# Patient Record
Sex: Female | Born: 2000 | Race: White | Hispanic: No | Marital: Single | Smoking: Never smoker
Health system: Southern US, Community
[De-identification: ages and names within clinical notes are randomized; demographics above are authoritative.]

## PROBLEM LIST (undated history)

## (undated) DIAGNOSIS — Q639 Congenital malformation of kidney, unspecified: Secondary | ICD-10-CM

## (undated) DIAGNOSIS — N289 Disorder of kidney and ureter, unspecified: Secondary | ICD-10-CM

---
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
## 2020-02-01 ENCOUNTER — Emergency Department: Payer: Medicaid Other

## 2020-02-01 ENCOUNTER — Emergency Department
Admission: EM | Admit: 2020-02-01 | Discharge: 2020-02-01 | Disposition: A | Discharge status: Home / Self Care | Payer: Medicaid Other | Attending: Emergency Medicine | Admitting: Emergency Medicine

## 2020-02-01 ENCOUNTER — Other Ambulatory Visit: Payer: Self-pay

## 2020-02-01 ENCOUNTER — Encounter: Payer: Self-pay | Admitting: Emergency Medicine

## 2020-02-01 DIAGNOSIS — S9031XA Contusion of right foot, initial encounter: Secondary | ICD-10-CM

## 2020-02-01 DIAGNOSIS — W2209XA Striking against other stationary object, initial encounter: Secondary | ICD-10-CM | POA: Diagnosis not present

## 2020-02-01 DIAGNOSIS — R52 Pain, unspecified: Secondary | ICD-10-CM

## 2020-02-01 DIAGNOSIS — Q600 Renal agenesis, unilateral: Secondary | ICD-10-CM | POA: Insufficient documentation

## 2020-02-01 DIAGNOSIS — S90414A Abrasion, right lesser toe(s), initial encounter: Secondary | ICD-10-CM | POA: Insufficient documentation

## 2020-02-01 DIAGNOSIS — Y9389 Activity, other specified: Secondary | ICD-10-CM | POA: Insufficient documentation

## 2020-02-01 DIAGNOSIS — S99921A Unspecified injury of right foot, initial encounter: Secondary | ICD-10-CM | POA: Diagnosis present

## 2020-02-01 DIAGNOSIS — Y999 Unspecified external cause status: Secondary | ICD-10-CM | POA: Diagnosis not present

## 2020-02-01 DIAGNOSIS — Y929 Unspecified place or not applicable: Secondary | ICD-10-CM | POA: Diagnosis not present

## 2020-02-01 NOTE — ED Provider Notes (Signed)
[facility] Emergency Department Provider Note   ____________________________________________   First MD Initiated Contact with Patient 02/01/20 1238     (approximate)  I have reviewed the triage vital signs and the nursing notes.   HISTORY  Chief Complaint Toe Pain    HPI Sherry Floyd is a 19 y.o. female patient presents with pain to the right second toe secondary to contusion/abrasion.  Patient state her toe was "underneath the door that her brother was coming through.  Incident occurred prior to arrival.  Patient rates pain as a 3/10.  Patient described the pain as "achy".  No palliative measure for complaint.         Past Medical History:  Diagnosis Date  . 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 CELLULITIS, LEFT KNEE 08/18/2009    History reviewed. No pertinent surgical history.  Prior to Admission medications   Medication Sig Start Date End Date Taking? Authorizing Provider  fluticasone (FLONASE) 50 MCG/ACT nasal spray Place 2 sprays into both nostrils daily as needed for allergies or rhinitis.    [provider]    Allergies Patient has no known allergies.  Family History  Problem Relation Age of Onset  . Hypertension Other   . Liver disease Other     Social History Social History   Tobacco Use  . Smoking status: Never Smoker  . Smokeless tobacco: Never Used  Substance Use Topics  . Alcohol use: No  . Drug use: No    Review of Systems Constitutional: No fever/chills Eyes: No visual changes. ENT: No sore throat. Cardiovascular: Denies chest pain. Respiratory: Denies shortness of breath. Gastrointestinal: No abdominal pain.  No nausea, no vomiting.  No diarrhea.  No constipation. Genitourinary: Negative for dysuria. Musculoskeletal: Right second toe pain. Skin: Negative  for rash.  Abrasion right second toe Neurological: Negative for headaches, focal weakness or numbness.   ____________________________________________   PHYSICAL EXAM:  VITAL SIGNS: ED Triage Vitals  Enc Vitals Group     BP 02/01/20 1240 115/69     Pulse Rate 02/01/20 1240 84     Resp 02/01/20 1240 14     Temp 02/01/20 1240 98.6 F (37 C)     Temp src --      SpO2 02/01/20 1240 99 %     Weight 02/01/20 1235 103 lb (46.7 kg)     Height 02/01/20 1235 5\' 3"  (1.6 m)     Head Circumference --      Peak Flow --      Pain Score 02/01/20 1235 3     Pain Loc --      Pain Edu? --      Excl. in GC? --     Constitutional: Alert and oriented. Well appearing and in no acute distress. Cardiovascular: Normal rate, regular rhythm. Grossly normal heart sounds.  Good peripheral circulation. Respiratory: Normal respiratory effort.  No retractions. Lungs CTAB. Musculoskeletal: No obvious deformity to the right second toe.  Neurologic:  Normal speech and language. No gross focal neurologic deficits are appreciated. No gait instability. Skin:  Skin is warm, dry and intact. No rash noted.  Abrasion dorsal aspect of second right toe. Psychiatric: Mood and affect are normal. Speech and behavior are normal.  ____________________________________________   LABS (all labs ordered are listed, but  only abnormal results are displayed)  Labs Reviewed - No data to display ____________________________________________  EKG   ____________________________________________  RADIOLOGY  ED MD interpretation:    Official radiology report(s): DG Foot Complete Right  Result Date: 02/01/2020 CLINICAL DATA:  Right foot pain after striking foot on door yesterday. Pain in the great and second toe. EXAM: RIGHT FOOT COMPLETE - 3+ VIEW COMPARISON:  Radiograph 09/28/2017 FINDINGS: There is no evidence of fracture or dislocation. There is no evidence of arthropathy or other focal bone abnormality. Suggestion of pes  planus on these nonweightbearing views. Soft tissues are unremarkable. IMPRESSION: No fracture or dislocation of the right foot. Electronically Signed   By: Keith Rake M.D.   On: 02/01/2020 13:13    ____________________________________________   PROCEDURES  Procedure(s) performed (including Critical Care):  Procedures   ____________________________________________   INITIAL IMPRESSION / ASSESSMENT AND PLAN / ED COURSE  As part of my medical decision making, I reviewed the following data within the [place]     Patient presents with pain to the first and second digit right foot secondary to contusion.  Discussed negative x-ray findings with patient.  Patient given discharge care instruction advised follow-up PCP.    Sherry Floyd was evaluated in Emergency Department on 02/01/2020 for the symptoms described in the history of present illness. She was evaluated in the context of the global COVID-19 pandemic, which necessitated consideration that the patient might be at risk for infection with the SARS-CoV-2 virus that causes COVID-19. Institutional protocols and algorithms that pertain to the evaluation of patients at risk for COVID-19 are in a state of rapid change based on information released by regulatory bodies including the CDC and federal and state organizations. These policies and algorithms were followed during the patient's care in the ED.       ____________________________________________   FINAL CLINICAL IMPRESSION(S) / ED DIAGNOSES  Final diagnoses:  Pain  Contusion of right foot, initial encounter     ED Discharge Orders    None       Note:  This document was prepared using Dragon voice recognition software and may include unintentional dictation errors.    Sable Feil, PA-C 02/01/20 1323    Vanessa Austin, MD 02/02/20 [phone]

## 2020-02-01 NOTE — ED Triage Notes (Signed)
States her brother went thur a Building services engineer back her right 2nd toe

## 2020-02-01 NOTE — Discharge Instructions (Addendum)
Your x-rays negative for fracture.  Follow discharge care instructions advised over-the-counter ibuprofen or Tylenol as needed for pain.

## 2021-06-30 IMAGING — DX DG FOOT COMPLETE 3+V*R*
3 series · 3 of 3 positions shown · non-contrast
Comparison: Radiograph 09/28/2017

CLINICAL DATA: Right foot pain after striking foot on door
yesterday. Pain in the great and second toe.

EXAM:
RIGHT FOOT COMPLETE - 3+ VIEW

[toe ap]
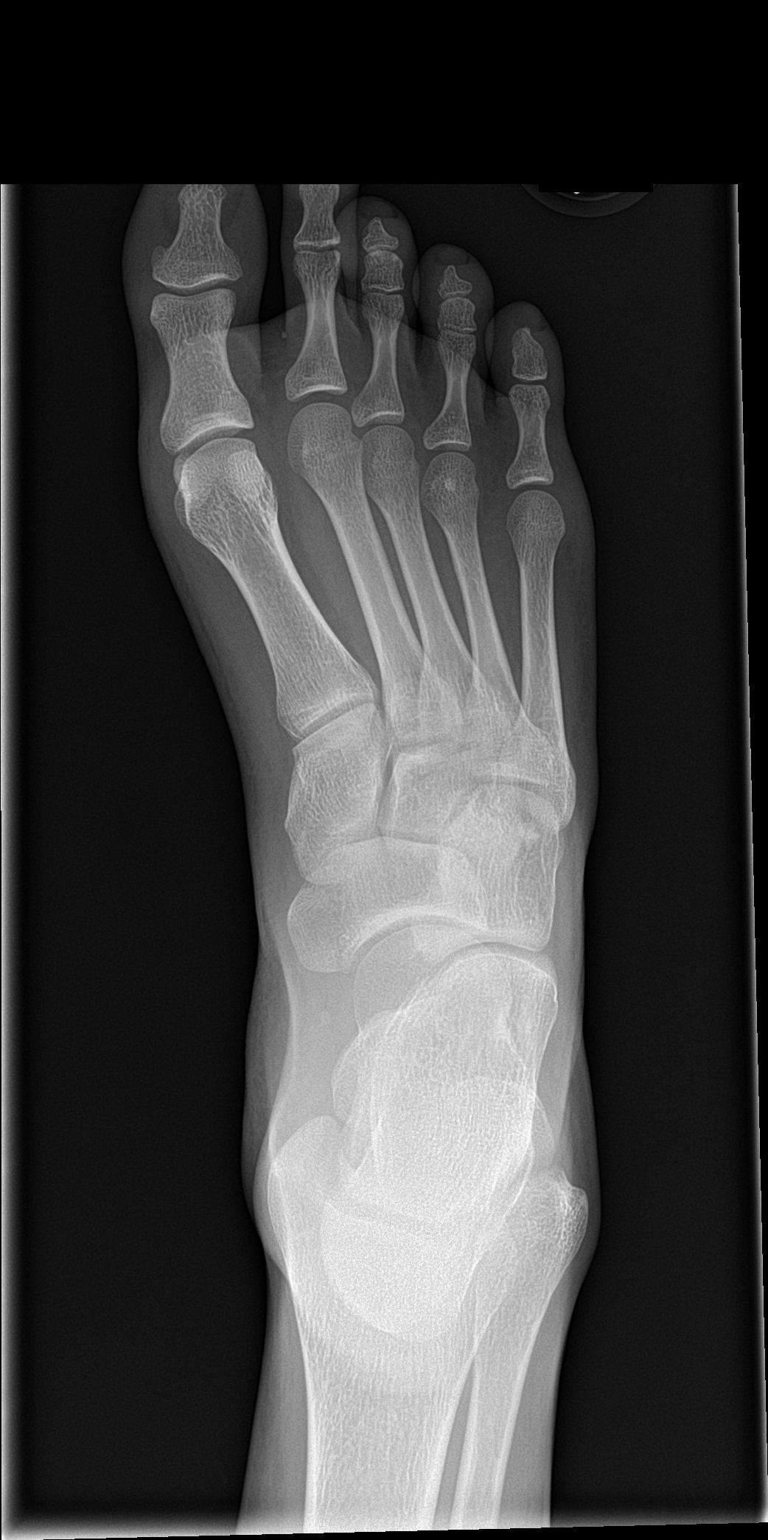

[toe obl]
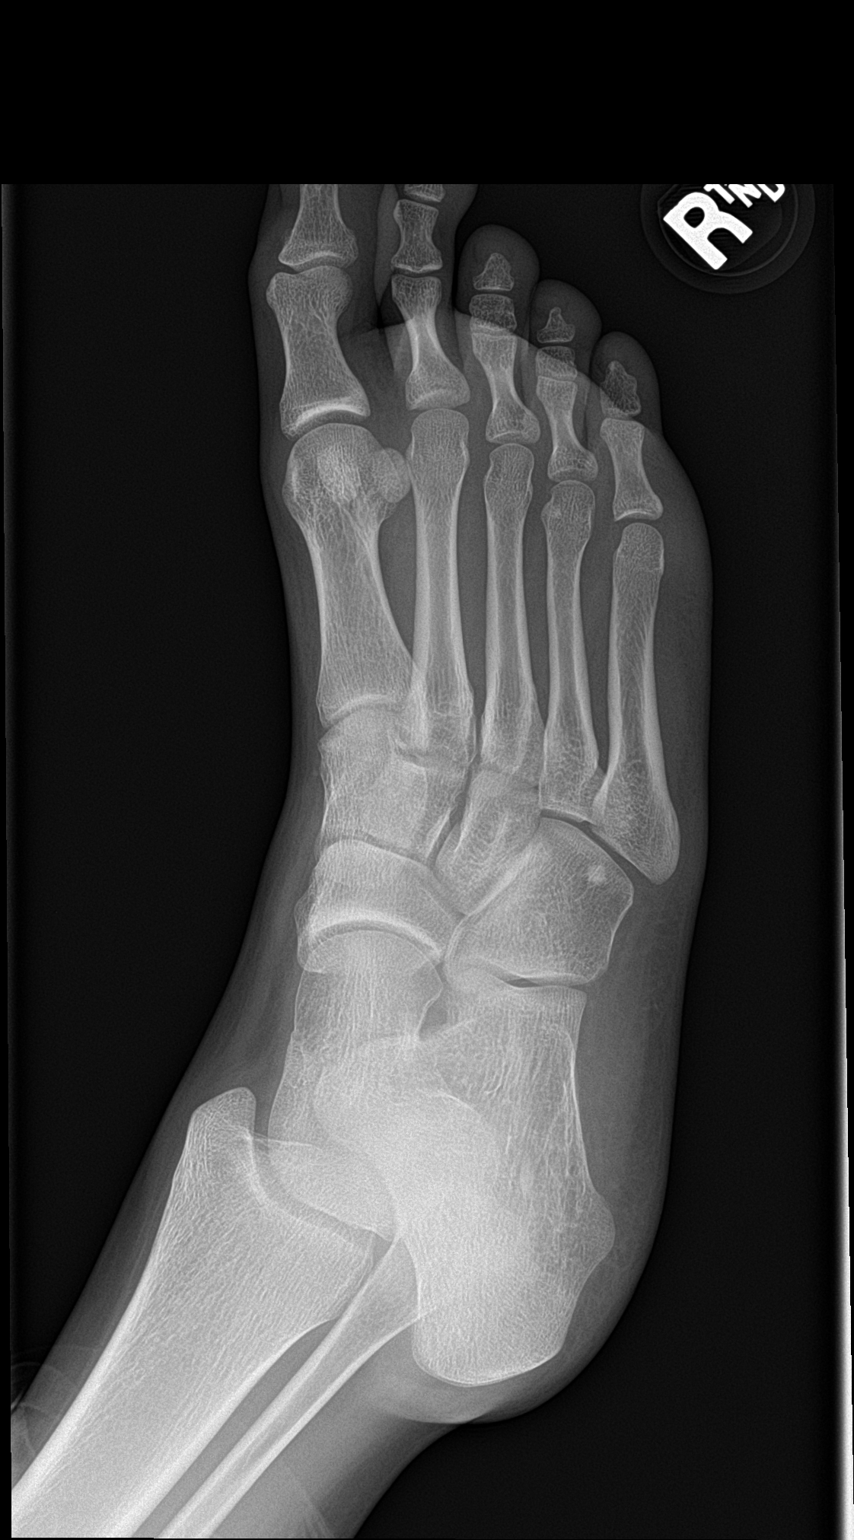

[toe lat]
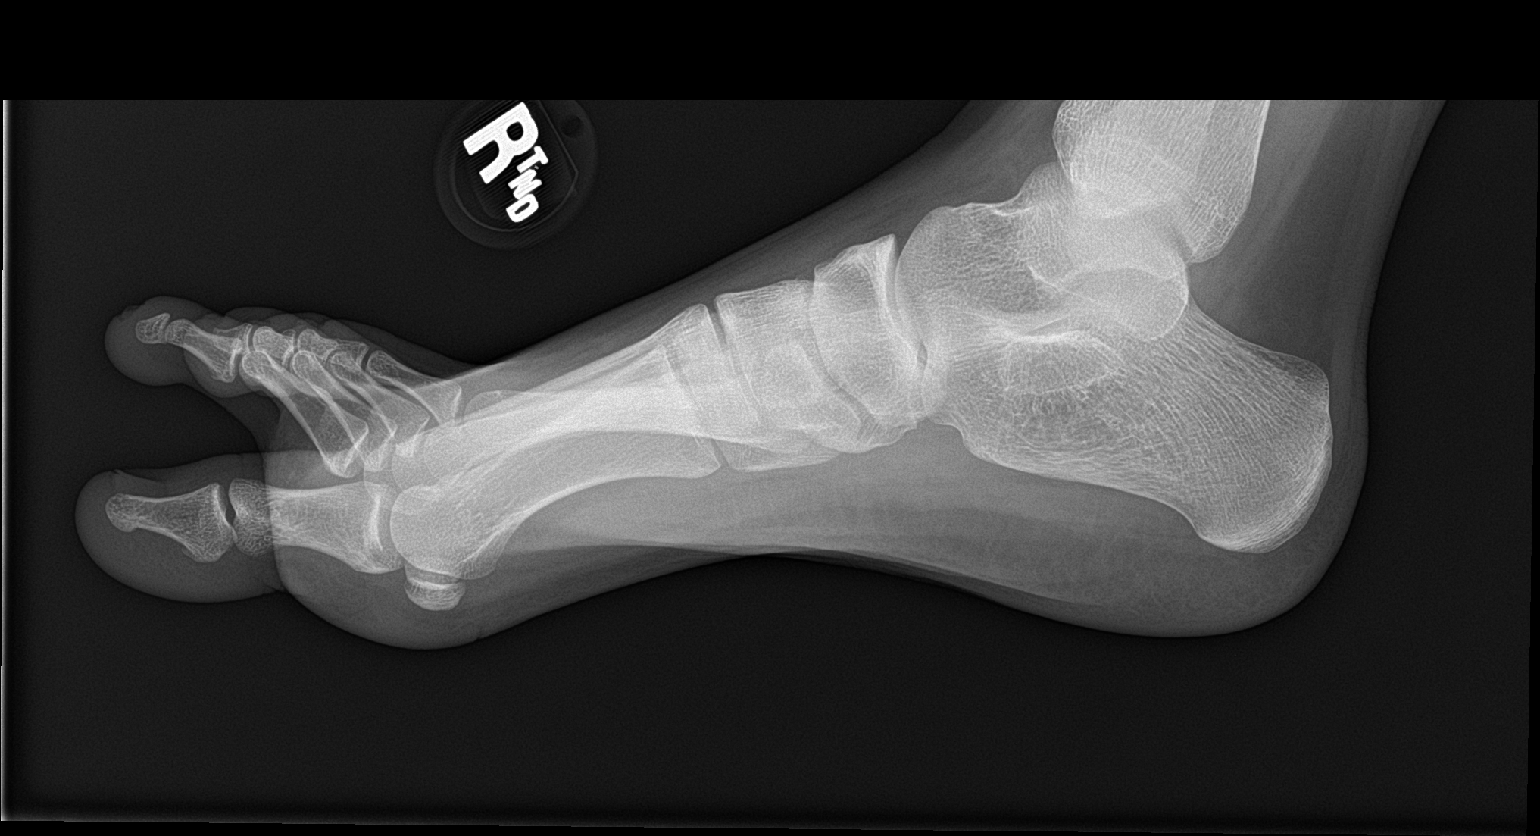

[3 of 3 positions shown; findings below may reference images not displayed]

FINDINGS: There is no evidence of fracture or dislocation. There is no
evidence of arthropathy or other focal bone abnormality. Suggestion
of pes planus on these nonweightbearing views. Soft tissues are
unremarkable.
IMPRESSION: No fracture or dislocation of the right foot.

## 2022-01-05 ENCOUNTER — Telehealth: Payer: Self-pay

## 2022-01-05 NOTE — Telephone Encounter (Signed)
Copied from [place] [phone]. Topic: General - Other >> Jan 05, 2022  1:09 PM McGill, Nelva Bush wrote: Reason for CRM: Pt requested a call back to schedule a new patient appointment.  Insurance - MEDICAID

## 2022-01-05 NOTE — Telephone Encounter (Signed)
Appt made

## 2022-02-26 ENCOUNTER — Encounter: Payer: Medicaid Other | Admitting: Obstetrics

## 2022-03-15 ENCOUNTER — Ambulatory Visit: Payer: Medicaid Other | Admitting: Nurse Practitioner

## 2022-03-16 ENCOUNTER — Other Ambulatory Visit (HOSPITAL_COMMUNITY)
Admission: RE | Admit: 2022-03-16 | Discharge: 2022-03-16 | Disposition: A | Discharge status: Home / Self Care | Payer: Medicaid Other | Source: Ambulatory Visit | Attending: Nurse Practitioner | Admitting: Nurse Practitioner

## 2022-03-16 ENCOUNTER — Ambulatory Visit (INDEPENDENT_AMBULATORY_CARE_PROVIDER_SITE_OTHER): Payer: Medicaid Other | Admitting: Nurse Practitioner

## 2022-03-16 ENCOUNTER — Other Ambulatory Visit: Payer: Self-pay

## 2022-03-16 ENCOUNTER — Encounter: Payer: Self-pay | Admitting: Nurse Practitioner

## 2022-03-16 VITALS — BP 110/70 | HR 98 | Temp 99.0°F | Resp 18 | Ht 63.5 in | Wt 118.5 lb

## 2022-03-16 DIAGNOSIS — Z309 Encounter for contraceptive management, unspecified: Secondary | ICD-10-CM

## 2022-03-16 DIAGNOSIS — R634 Abnormal weight loss: Secondary | ICD-10-CM

## 2022-03-16 DIAGNOSIS — Z7689 Persons encountering health services in other specified circumstances: Secondary | ICD-10-CM

## 2022-03-16 DIAGNOSIS — R350 Frequency of micturition: Secondary | ICD-10-CM | POA: Diagnosis not present

## 2022-03-16 DIAGNOSIS — F419 Anxiety disorder, unspecified: Secondary | ICD-10-CM | POA: Insufficient documentation

## 2022-03-16 DIAGNOSIS — Z114 Encounter for screening for human immunodeficiency virus [HIV]: Secondary | ICD-10-CM

## 2022-03-16 DIAGNOSIS — Z113 Encounter for screening for infections with a predominantly sexual mode of transmission: Secondary | ICD-10-CM | POA: Diagnosis not present

## 2022-03-16 DIAGNOSIS — Z1159 Encounter for screening for other viral diseases: Secondary | ICD-10-CM

## 2022-03-16 DIAGNOSIS — Z3009 Encounter for other general counseling and advice on contraception: Secondary | ICD-10-CM

## 2022-03-16 DIAGNOSIS — F320 Major depressive disorder, single episode, mild: Secondary | ICD-10-CM | POA: Insufficient documentation

## 2022-03-16 DIAGNOSIS — H6121 Impacted cerumen, right ear: Secondary | ICD-10-CM | POA: Diagnosis not present

## 2022-03-16 LAB — POCT URINALYSIS DIPSTICK
Bilirubin, UA: NEGATIVE
Color, UA: NEGATIVE
Glucose, UA: NEGATIVE
Ketones, UA: NEGATIVE
Nitrite, UA: NEGATIVE
Protein, UA: POSITIVE — AB
Spec Grav, UA: 1.02 (ref 1.010–1.025)
Urobilinogen, UA: 0.2 E.U./dL
pH, UA: 6 (ref 5.0–8.0)

## 2022-03-16 LAB — POCT URINE PREGNANCY: Preg Test, Ur: NEGATIVE

## 2022-03-16 MED ORDER — SERTRALINE HCL 25 MG PO TABS: 25.0000 mg | ORAL_TABLET | Freq: Every day | ORAL | 0 refills | Status: DC

## 2022-03-16 MED ORDER — MEDROXYPROGESTERONE ACETATE 150 MG/ML IM SUSY
150.0000 mg | PREFILLED_SYRINGE | INTRAMUSCULAR | Status: AC
  Administered 2022-03-16: 150 mg via INTRAMUSCULAR

## 2022-03-16 NOTE — Progress Notes (Signed)
? ?BP 110/70   Pulse 98   Temp 99 ?F (37.2 ?C) (Oral)   Resp 18   Ht 5' 3.5" (1.613 m)   Wt 118 lb 8 oz (53.8 kg)   SpO2 98%   BMI 20.66 kg/m?   ? ?Subjective:  ? ? Patient ID: Sherry Floyd, female    DOB: 02/23/2001, 21 y.o.   MRN: [id number] ? ?HPI: ?Sherry Floyd is a 21 y.o. female ? ?Chief Complaint  ?Patient presents with  ? Establish Care  ? Contraception  ?  Depo injection last depo per [facility] 12/28/21  ? Ear Fullness  ? ?Establish care: she does not remember. 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  ? ?Urinary urgency and frequency: She says over the last few weeks she has noticed that she has been having urinary urgency and frequency off and on.  She denies any fever or back pain.  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  She denies any vaginal discharge.  We will get urine dip and send for culture if indicated. ? ?Ear fullness: She says that her ears feel full and itchy.  She says that it has been going on for two or three days.  She denies any pain.  Right ear is impacted with cerumen. Left ear clear.   Discussed ear lavage with patient, patient in agreement with plan.  ?Verbal consent given ?Possible side effects discussed with patient ?Ears were  lavaged with warm water and peroxide  ?Patient tolerated procedure well ?No complications   ?Right ear irrigated, some cerumen came out but not all. Ear canal is getting irritated, discussed with patient to stop procedure and to use Debrox or hydrogen peroxide/warm water mixture at home to help loosen the wax.   ? ?Contraception: She has been on Depo for about six months.  Will get pregnancy test and then will administer depo shot. Her last Depo shot was 2/6 at pediatrician office.  ? ?Weight-loss: she says that she has been losing weight last visit at about 127 and today she is 118  today and she has concerns. Discussed can be caused by stress, will get lab work to rule out other causes. ? ?Anxiety/Depression: Patient reports that she has  been very anxious and feels depressed.  She says she is been feeling this way the last few months.  PHQ-9 and GAD is positive. She denies any suicidal thoughts. She says she feels irritated all the time and that she does lash out. She is not currently in counseling. We discussed treatment options and patient would like to take Zoloft.  ? ? ?  03/16/2022  ?  2:29 PM 03/16/2022  ?  2:12 PM  ?Depression screen PHQ 2/9  ?Decreased Interest 2 2  ?Down, Depressed, Hopeless 2 2  ?PHQ - 2 Score 4 4  ?Altered sleeping 0   ?Tired, decreased energy 1 1  ?Change in appetite 1 1  ?Feeling bad or failure about yourself  0 0  ?Trouble concentrating 1 1  ?Moving slowly or fidgety/restless 0 0  ?Suicidal thoughts 0 0  ?PHQ-9 Score 7   ?Difficult doing work/chores Somewhat difficult Somewhat difficult  ?  ? ?  03/16/2022  ?  2:13 PM  ?GAD 7 : Generalized Anxiety Score  ?Nervous, Anxious, on Edge 2  ?Control/stop worrying 1  ?Worry too much - different things 1  ?Trouble relaxing 0  ?Restless 0  ?Easily annoyed or irritable 3  ?Afraid - awful might happen 0  ?  Total GAD 7 Score 7  ?Anxiety Difficulty Somewhat difficult  ? ?  ?Relevant past medical, surgical, family and social history reviewed and updated as indicated. Interim medical history since our last visit reviewed. ?Allergies and medications reviewed and updated. ? ?Review of Systems ? ?Constitutional: Negative for fever, positive for weight change.  ?HEENT: positive for ear fullness ?Respiratory: Negative for cough and shortness of breath.   ?Cardiovascular: Negative for chest pain or palpitations.  ?Gastrointestinal: Negative for abdominal pain, no bowel changes.  ?Musculoskeletal: Negative for gait problem or joint swelling.  ?Skin: Negative for rash.  ?Neurological: Negative for dizziness or headache.  ?No other specific complaints in a complete review of systems (except as listed in HPI above).  ? ?   ?Objective:  ?  ?BP 110/70   Pulse 98   Temp 99 ?F (37.2 ?C) (Oral)    Resp 18   Ht 5' 3.5" (1.613 m)   Wt 118 lb 8 oz (53.8 kg)   SpO2 98%   BMI 20.66 kg/m?   ?Wt Readings from Last 3 Encounters:  ?03/16/22 118 lb 8 oz (53.8 kg)  ?02/01/20 103 lb (46.7 kg) (7 %, Z= -1.51)*  ?02/26/19 110 lb (49.9 kg) (20 %, Z= -0.86)*  ? ?* Growth percentiles are based on CDC (Girls, 2-20 Years) data.  ?  ?Physical Exam ? ?Constitutional: Patient appears well-developed and well-nourished. No distress.  ?HEENT: head atraumatic, normocephalic, pupils equal and reactive to light, ears left TM clear, right ear cerumen impaction, after irrigation right ear still impacted, neck supple ?Cardiovascular: Normal rate, regular rhythm and normal heart sounds.  No murmur heard. No BLE edema. ?Pulmonary/Chest: Effort normal and breath sounds normal. No respiratory distress. ?Abdominal: Soft.  There is no tenderness. ?Psychiatric: Patient has a normal mood and affect. behavior is normal. Judgment and thought content normal.  ? ?Results for orders placed or performed in visit on 03/16/22  ?POCT urine pregnancy  ?Result Value Ref Range  ? Preg Test, Ur Negative Negative  ?POCT urinalysis dipstick  ?Result Value Ref Range  ? Color, UA negative   ? Clarity, UA cloudy   ? Glucose, UA Negative Negative  ? Bilirubin, UA negative   ? Ketones, UA negative   ? Spec Grav, UA 1.020 1.010 - 1.025  ? Blood, UA small   ? pH, UA 6.0 5.0 - 8.0  ? Protein, UA Positive (A) Negative  ? Urobilinogen, UA 0.2 0.2 or 1.0 E.U./dL  ? Nitrite, UA negative   ? Leukocytes, UA Trace (A) Negative  ? Appearance cloudy   ? Odor none   ? ?   ?Assessment & Plan:  ? ?1. Urinary frequency ?Urine negative for infection ?- POCT urinalysis dipstick ? ?2. Impacted cerumen of right ear ?Unable to get all wax out of the right ear ?Recommend using Debrox eardrops to help loosen wax or warm water/hydrogen peroxide mixture a couple drops every night ?- Ear Lavage ? ?3. Family planning ?Administered Depo-Provera shot ?- POCT urine pregnancy ? ?4. Weight  loss ?Likely due to stress ?- CBC with Differential/Platelet ?- COMPLETE METABOLIC PANEL WITH GFR ?- TSH ? ?5. Anxiety ? ?- sertraline (ZOLOFT) 25 MG tablet; Take 1 tablet (25 mg total) by mouth daily.  Dispense: 45 tablet; Refill: 0 ? ?6. Current mild episode of major depressive disorder without prior episode (HCC) ? ?- sertraline (ZOLOFT) 25 MG tablet; Take 1 tablet (25 mg total) by mouth daily.  Dispense: 45 tablet; Refill: 0 ? ?7. Encounter for  hepatitis C screening test for low risk patient ?- Hepatitis C antibody ? ?8. Screening for HIV without presence of risk factors ? ?- HIV Antibody (routine testing w rflx) ?- Hepatitis C antibody ? ?9. Screening for STD (sexually transmitted disease) ? ?- HIV Antibody (routine testing w rflx) ?- Hepatitis C antibody ?- RPR ?- Urine cytology ancillary only ? ?10. Encounter for contraceptive management, unspecified type ? ?- medroxyPROGESTERone Acetate SUSY 150 mg ? ?11. Encounter to establish care ?- will need to schedule cpe ?  ? ?Follow up plan: ?Return in about 6 weeks (around 04/27/2022) for follow up. ? ? ? ? ? ?

## 2022-03-17 LAB — COMPLETE METABOLIC PANEL WITH GFR
AG Ratio: 2.1 (calc) (ref 1.0–2.5)
ALT: 11 U/L (ref 6–29)
AST: 14 U/L (ref 10–30)
Albumin: 4.9 g/dL (ref 3.6–5.1)
Alkaline phosphatase (APISO): 80 U/L (ref 31–125)
BUN: 9 mg/dL (ref 7–25)
CO2: 27 mmol/L (ref 20–32)
Calcium: 10.6 mg/dL — ABNORMAL HIGH (ref 8.6–10.2)
Chloride: 106 mmol/L (ref 98–110)
Creat: 0.73 mg/dL (ref 0.50–0.96)
Globulin: 2.3 g/dL (calc) (ref 1.9–3.7)
Glucose, Bld: 84 mg/dL (ref 65–99)
Potassium: 4.9 mmol/L (ref 3.5–5.3)
Sodium: 145 mmol/L (ref 135–146)
Total Bilirubin: 0.8 mg/dL (ref 0.2–1.2)
Total Protein: 7.2 g/dL (ref 6.1–8.1)
eGFR: 120 mL/min/{1.73_m2} (ref >=60)

## 2022-03-17 LAB — CBC WITH DIFFERENTIAL/PLATELET
Absolute Monocytes: 592 cells/uL (ref 200–950)
Basophils Absolute: 40 cells/uL (ref 0–200)
Basophils Relative: 0.5 %
Eosinophils Absolute: 40 cells/uL (ref 15–500)
Eosinophils Relative: 0.5 %
HCT: 48.6 % — ABNORMAL HIGH (ref 35.0–45.0)
Hemoglobin: 15.7 g/dL — ABNORMAL HIGH (ref 11.7–15.5)
Lymphs Abs: 1832 cells/uL (ref 850–3900)
MCH: 28.1 pg (ref 27.0–33.0)
MCHC: 32.3 g/dL (ref 32.0–36.0)
MCV: 86.9 fL (ref 80.0–100.0)
MPV: 12.6 fL — ABNORMAL HIGH (ref 7.5–12.5)
Monocytes Relative: 7.4 %
Neutro Abs: 5496 cells/uL (ref 1500–7800)
Neutrophils Relative %: 68.7 %
Platelets: 248 10*3/uL (ref 140–400)
RBC: 5.59 10*6/uL — ABNORMAL HIGH (ref 3.80–5.10)
RDW: 12.7 % (ref 11.0–15.0)
Total Lymphocyte: 22.9 %
WBC: 8 10*3/uL (ref 3.8–10.8)

## 2022-03-17 LAB — HEPATITIS C ANTIBODY
Hepatitis C Ab: NONREACTIVE
SIGNAL TO CUT-OFF: 0.12 (ref <1.00)

## 2022-03-17 LAB — TSH: TSH: 0.8 mIU/L

## 2022-03-17 LAB — RPR: RPR Ser Ql: NONREACTIVE

## 2022-03-17 LAB — HIV ANTIBODY (ROUTINE TESTING W REFLEX): HIV 1&2 Ab, 4th Generation: NONREACTIVE

## 2022-03-18 LAB — URINE CYTOLOGY ANCILLARY ONLY
Chlamydia: NEGATIVE
Comment: NEGATIVE
Comment: NEGATIVE
Comment: NORMAL
Neisseria Gonorrhea: NEGATIVE
Trichomonas: NEGATIVE

## 2022-03-19 ENCOUNTER — Telehealth (INDEPENDENT_AMBULATORY_CARE_PROVIDER_SITE_OTHER): Payer: Medicaid Other | Admitting: Nurse Practitioner

## 2022-03-19 ENCOUNTER — Ambulatory Visit: Payer: Self-pay

## 2022-03-19 ENCOUNTER — Ambulatory Visit: Payer: Self-pay | Admitting: *Deleted

## 2022-03-19 DIAGNOSIS — F419 Anxiety disorder, unspecified: Secondary | ICD-10-CM | POA: Diagnosis not present

## 2022-03-19 DIAGNOSIS — T50905A Adverse effect of unspecified drugs, medicaments and biological substances, initial encounter: Secondary | ICD-10-CM | POA: Diagnosis not present

## 2022-03-19 DIAGNOSIS — F320 Major depressive disorder, single episode, mild: Secondary | ICD-10-CM | POA: Diagnosis not present

## 2022-03-19 NOTE — Telephone Encounter (Addendum)
Summary: Medication Managment  ? Patient was advised by PCP to d/c sertraline (ZOLOFT) 25 MG tablet. Patient took last pill at 11 am this morning and would like to know if she can have a alcoholic beverage today, please advise   ?  ?Called pt - LMOM ?Per [employer] - Alcohol use is not recommended. ? ? ?

## 2022-03-19 NOTE — Telephone Encounter (Signed)
Reason for Disposition ?? Caller has medicine question only, adult not sick, AND triager answers question ? ?Answer Assessment - Initial Assessment Questions ?1. NAME of MEDICATION: "What medicine are you calling about?" ?    Sertraline and alcohol  ?2. QUESTION: "What is your question?" (e.g., double dose of medicine, side effect) ?    Patient took last pill today- can she drink alcohol today ?3. PRESCRIBING HCP: "Who prescribed it?" Reason: if prescribed by specialist, call should be referred to that group. ?    PCP ?Advised: Alcohol use while taking medication not recommended. ? ?Protocols used: Medication Question Call-A-AH ? ?

## 2022-03-19 NOTE — Progress Notes (Signed)
? ?Name: Sherry Floyd   MRN: [id number]    DOB: Mar 22, 2001   Date:03/19/2022 ? ?     Progress Note ? ?Subjective ? ?Chief Complaint ? ?Chief Complaint  ?Patient presents with  ? Medication Reaction  ? ? ?I connected with  LATONDA LARRIVEE  on 03/19/22 at 12:30 pm by a video enabled telemedicine application and verified that I am speaking with the correct person using two identifiers.  I discussed the limitations of evaluation and management by telemedicine and the availability of in person appointments. The patient expressed understanding and agreed to proceed with a virtual visit  Staff also discussed with the patient that there may be a patient responsible charge related to this service. ?Patient Location: home ?Provider Location: [facility] ?Additional Individuals present: alone ? ?HPI ? ?Medication reaction/depression/anxiety:She was seen on 03/16/2022 and started on Zoloft for her anxiety and depression. She took her first dose this morning and about 20-30 minutes after taking it she started having dizziness, sweating, headache, and nausea. She also says that she felt like she was out side of her body.  She says that her symptoms are improving.  She says she still has a slight headache and nausea but she feels like that has improved.  She denies any shortness of breath or her throat closing. Discussed not taking the zoloft any more.  Zoloft has a 26 hour half life. Discussed that if her symptoms worsen or if she has chest pain, shortness of breath or feels like her throat is closing to go to the emergency room. She is agreeable to plan. Discussed we will have to consider a different treatment for her anxiety and depression but we will wait until the zoloft is out of her system.  ? ?Patient Active Problem List  ? Diagnosis Date Noted  ? Anxiety 03/16/2022  ? Current mild episode of major depressive disorder without prior episode ([place]) 03/16/2022  ? CELLULITIS, LEFT KNEE 08/18/2009  ? ? ?Social History  ? ?Tobacco  Use  ? Smoking status: Never  ? Smokeless tobacco: Never  ?Substance Use Topics  ? Alcohol use: Yes  ? ? ? ?Current Outpatient Medications:  ?  fluticasone (FLONASE) 50 MCG/ACT nasal spray, Place 2 sprays into both nostrils daily as needed for allergies or rhinitis. (Patient not taking: Reported on 03/16/2022), Disp: , Rfl:  ?  medroxyPROGESTERone Acetate 150 MG/ML SUSY, Inject into the muscle., Disp: , Rfl:  ?  sertraline (ZOLOFT) 25 MG tablet, Take 1 tablet (25 mg total) by mouth daily., Disp: 45 tablet, Rfl: 0 ? ?No Known Allergies ? ?I personally reviewed active problem list, medication list, allergies, notes from last encounter with the patient/caregiver today. ? ?ROS ? ?Constitutional: Negative for fever or weight change.  ?Respiratory: Negative for cough and shortness of breath.   ?Cardiovascular: Negative for chest pain or palpitations.  ?Gastrointestinal: Negative for abdominal pain, no bowel changes. Positive for nausea ?Musculoskeletal: Negative for gait problem or joint swelling.  ?Skin: Negative for rash.  ?Neurological: Negative for dizziness, positive for headache.  ?No other specific complaints in a complete review of systems (except as listed in HPI above).  ? ?Objective ? ?Virtual encounter, vitals not obtained. ? ?There is no height or weight on file to calculate BMI. ? ?Nursing Note and Vital Signs reviewed. ? ?Physical Exam ? ?Awake, alert and oriented, speaking in complete sentences ? ?Results for orders placed or performed in visit on 03/16/22 (from the past 72 hour(s))  ?Urine cytology ancillary only  Status: None  ? Collection Time: 03/16/22  2:41 PM  ?Result Value Ref Range  ? Neisseria Gonorrhea Negative   ? Chlamydia Negative   ? Trichomonas Negative   ? Comment Normal Reference [place] Chlamydia - Negative   ? Comment    ?  Normal Reference Range Neisseria Gonorrhea - Negative  ? Comment Normal Reference Range Trichomonas - Negative   ?POCT urine pregnancy     Status: None  ? Collection  Time: 03/16/22  2:58 PM  ?Result Value Ref Range  ? Preg Test, Ur Negative Negative  ?POCT urinalysis dipstick     Status: Abnormal  ? Collection Time: 03/16/22  2:58 PM  ?Result Value Ref Range  ? Color, UA negative   ? Clarity, UA cloudy   ? Glucose, UA Negative Negative  ? Bilirubin, UA negative   ? Ketones, UA negative   ? Spec Grav, UA 1.020 1.010 - 1.025  ? Blood, UA small   ? pH, UA 6.0 5.0 - 8.0  ? Protein, UA Positive (A) Negative  ? Urobilinogen, UA 0.2 0.2 or 1.0 E.U./dL  ? Nitrite, UA negative   ? Leukocytes, UA Trace (A) Negative  ? Appearance cloudy   ? Odor none   ?CBC with Differential/Platelet     Status: Abnormal  ? Collection Time: 03/16/22  3:05 PM  ?Result Value Ref Range  ? WBC 8.0 3.8 - 10.8 Thousand/uL  ? RBC 5.59 (H) 3.80 - 5.10 Million/uL  ? Hemoglobin 15.7 (H) 11.7 - 15.5 g/dL  ? HCT 48.6 (H) 35.0 - 45.0 %  ? MCV 86.9 80.0 - 100.0 fL  ? MCH 28.1 27.0 - 33.0 pg  ? MCHC 32.3 32.0 - 36.0 g/dL  ? RDW 12.7 11.0 - 15.0 %  ? Platelets 248 140 - 400 Thousand/uL  ? MPV 12.6 (H) 7.5 - 12.5 fL  ? Neutro Abs 5,496 1,500 - 7,800 cells/uL  ? Lymphs Abs 1,832 850 - 3,900 cells/uL  ? Absolute Monocytes 592 200 - 950 cells/uL  ? Eosinophils Absolute 40 15 - 500 cells/uL  ? Basophils Absolute 40 0 - 200 cells/uL  ? Neutrophils Relative % 68.7 %  ? Total Lymphocyte 22.9 %  ? Monocytes Relative 7.4 %  ? Eosinophils Relative 0.5 %  ? Basophils Relative 0.5 %  ?COMPLETE METABOLIC PANEL WITH GFR     Status: Abnormal  ? Collection Time: 03/16/22  3:05 PM  ?Result Value Ref Range  ? Glucose, Bld 84 65 - 99 mg/dL  ?  Comment: . ?           Fasting reference interval ?. ?  ? BUN 9 7 - 25 mg/dL  ? Creat 0.73 0.50 - 0.96 mg/dL  ? eGFR 120 > OR = 60 mL/min/1.67m  ?  Comment: The eGFR is based on the CKD-EPI 2021 equation. To calculate  ?the new eGFR from a previous Creatinine or Cystatin C ?result, go to https://www.kidney.org/professionals/ ?kdoqi/gfr%5Fcalculator ?  ? BUN/Creatinine Ratio NOT APPLICABLE 6 - 22  (calc)  ? Sodium 145 135 - 146 mmol/L  ? Potassium 4.9 3.5 - 5.3 mmol/L  ? Chloride 106 98 - 110 mmol/L  ? CO2 27 20 - 32 mmol/L  ? Calcium 10.6 (H) 8.6 - 10.2 mg/dL  ? Total Protein 7.2 6.1 - 8.1 g/dL  ? Albumin 4.9 3.6 - 5.1 g/dL  ? Globulin 2.3 1.9 - 3.7 g/dL (calc)  ? AG Ratio 2.1 1.0 - 2.5 (calc)  ? Total Bilirubin 0.8 0.2 -  1.2 mg/dL  ? Alkaline phosphatase (APISO) 80 31 - 125 U/L  ? AST 14 10 - 30 U/L  ? ALT 11 6 - 29 U/L  ?TSH     Status: None  ? Collection Time: 03/16/22  3:05 PM  ?Result Value Ref Range  ? TSH 0.80 mIU/L  ?  Comment:           Reference Range ?. ?          > or = 20 Years  0.40-4.50 ?. ?               Pregnancy Ranges ?          First trimester    0.26-2.66 ?          Second trimester   0.55-2.73 ?          Third trimester    0.43-2.91 ?  ?HIV Antibody (routine testing w rflx)     Status: None  ? Collection Time: 03/16/22  3:05 PM  ?Result Value Ref Range  ? HIV 1&2 Ab, 4th Generation NON-REACTIVE NON-REACTIVE  ?  Comment: HIV-1 antigen and HIV-1/HIV-2 antibodies were not ?detected. There is no laboratory evidence of HIV ?infection. ?. ?PLEASE NOTE: This information has been disclosed to ?you from records whose confidentiality may be ?protected by state law.  If your state requires such ?protection, then the state law prohibits you from ?making any further disclosure of the information ?without the specific written consent of the person ?to whom it pertains, or as otherwise permitted by law. ?A general authorization for the release of medical or ?other information is NOT sufficient for this purpose. ?. ?For additional information please refer to ?http://education.quest[employer].com/faq/FAQ106 ?(This link is being provided for informational/ ?educational purposes only.) ?. ?. ?The performance of this assay has not been clinically ?validated in patients less than 60 years old. ?. ?  ?Hepatitis C antibody     Status: None  ? Collection Time: 03/16/22  3:05 PM  ?Result Value Ref Range  ?  Hepatitis C Ab NON-REACTIVE NON-REACTIVE  ? SIGNAL TO CUT-OFF 0.12 <1.00  ?  Comment: . ?HCV antibody was non-reactive. There is no laboratory  ?evidence of HCV infection. ?. ?In most cases, no further action i

## 2022-03-19 NOTE — Telephone Encounter (Signed)
?  Chief Complaint: took zoloft 1 st time this am and now side effects  ?Symptoms: dizziness, lightheaded, sweating, chest discomfort earlier and difficulty breathing  feels paranoid ?Frequency: this am  ?Pertinent Negatives: Patient denies chest pain now  ?Disposition: [x] ED /[] Urgent Care (no appt availability in office) / [] Appointment(In office/virtual)/ []  [place] [facility]/ [] Home Care/ [] Refused Recommended Disposition /[] [place] [facility]/ []  Follow-up with PCP ?Additional Notes:  ? ?Please advise patient regarding taking new Rx zoloft.  ? ? ? Reason for Disposition ? [1] Caller has URGENT medicine question about med that PCP or specialist prescribed AND [2] triager unable to answer question ? ?Answer Assessment - Initial Assessment Questions ?1. NAME of MEDICATION: "What medicine are you calling about?" ?    Zoloft  ?2. QUESTION: "What is your question?" (e.g., double dose of medicine, side effect) ?    Side effects of taking medication this am dizzy, sweating, mild chest pain and breathing fast since taking  ?3. PRESCRIBING HCP: "Who prescribed it?" Reason: if prescribed by specialist, call should be referred to that group. ?    PCP ?4. SYMPTOMS: "Do you have any symptoms?" ?    Yes dizziness, sweating, paranoid, breathing difficulty ?5. SEVERITY: If symptoms are present, ask "Are they mild, moderate or severe?" ?   Yes sx continue  ?6. PREGNANCY:  "Is there any chance that you are pregnant?" "When was your last menstrual period?" ?    na ? ?Protocols used: Medication Question Call-A-AH ? ?

## 2022-04-07 ENCOUNTER — Encounter: Payer: Medicaid Other | Admitting: Obstetrics

## 2022-04-21 ENCOUNTER — Ambulatory Visit: Payer: Self-pay | Admitting: *Deleted

## 2022-04-21 ENCOUNTER — Ambulatory Visit (INDEPENDENT_AMBULATORY_CARE_PROVIDER_SITE_OTHER): Payer: Medicaid Other | Admitting: Family Medicine

## 2022-04-21 ENCOUNTER — Encounter: Payer: Self-pay | Admitting: Family Medicine

## 2022-04-21 VITALS — BP 122/72 | HR 104 | Temp 99.0°F | Resp 18 | Ht 63.0 in | Wt 122.0 lb

## 2022-04-21 DIAGNOSIS — M940 Chondrocostal junction syndrome [Tietze]: Secondary | ICD-10-CM

## 2022-04-21 NOTE — Progress Notes (Signed)
   SUBJECTIVE:   CHIEF COMPLAINT / HPI:   CHEST PAIN Duration:few days Onset: gradual Quality: dull Severity: 4/10 Location: L under breast Radiation: none Episode duration: 5 minutes Frequency: intermittent Related to exertion: no Activity when pain started: rest Trauma: no Anxiety/recent stressors: no Aggravating factors: none Alleviating factors: none Treatments attempted: nothing  Current pain status: pain free Shortness of breath: no Cough: no Nausea: no Diaphoresis: no Heartburn: no Palpitations: no No FH of early cardiac dz.   OBJECTIVE:   BP 122/72   Pulse (!) 104   Temp 99 F (37.2 C)   Resp 18   Ht 5\' 3"  (1.6 m)   Wt 122 lb (55.3 kg)   SpO2 99%   BMI 21.61 kg/m   Gen: well appearing, in NAD Card: RRR Lungs: CTAB MSK: TTP over lateral ribs 6-7. No overlying skin changes. Ext: WWP, no edema   ASSESSMENT/PLAN:   Costochondritis Reproduction of symptoms on palpation. Recommend NSAID prn for pain. Less likely cardiac, pulmonary etiology given lack of associated symptoms and low risk. Return and emergency precautions discussed.   Myles Gip, DO

## 2022-04-21 NOTE — Telephone Encounter (Signed)
  Chief Complaint: Chest pain Symptoms: Left sided at breast, "Always very slightly there but worse with movement, 4/10. Frequency: 2-3 days Pertinent Negatives: Patient denies does not radiate, no nausea, diaphoresis, no SOB Disposition: [] ED /[] Urgent Care (no appt availability in office) / [x] Appointment(In office/virtual)/ []  [place] [facility]/ [] Home Care/ [] Refused Recommended Disposition /[] [place] [facility]/ []  Follow-up with PCP Additional Notes: Secured appt for today. Care advise provided, Verbalizes understanding. Reason for Disposition  [1] Chest pain lasts < 5 minutes AND [2] NO chest pain or cardiac symptoms (e.g., breathing difficulty, sweating) now (Exception: chest pains that last only a few seconds)  Answer Assessment - Initial Assessment Questions 1. LOCATION: "Where does it hurt?"       Left side at breast 2. RADIATION: "Does the pain go anywhere else?" (e.g., into neck, jaw, arms, back)     No 3. ONSET: "When did the chest pain begin?" (Minutes, hours or days)      2-3 days ago 4. PATTERN "Does the pain come and go, or has it been constant since it started?"  "Does it get worse with exertion?"      Comes and goes. Worse with movement 5. DURATION: "How long does it last" (e.g., seconds, minutes, hours)     Always very mild there 6. SEVERITY: "How bad is the pain?"  (e.g., Scale 1-10; mild, moderate, or severe)    - MILD (1-3): doesn't interfere with normal activities     - MODERATE (4-7): interferes with normal activities or awakens from sleep    - SEVERE (8-10): excruciating pain, unable to do any normal activities       4/10 7. CARDIAC RISK FACTORS: "Do you have any history of heart problems or risk factors for heart disease?" (e.g., angina, prior heart attack; diabetes, high blood pressure, high cholesterol, smoker, or strong family history of heart disease)     *No Answer* 8. PULMONARY RISK FACTORS: "Do you have any history of lung disease?"  (e.g.,  blood clots in lung, asthma, emphysema, birth control pills)     *No Answer* 9. CAUSE: "What do you think is causing the chest pain?"     Unsure 10. OTHER SYMPTOMS: "Do you have any other symptoms?" (e.g., dizziness, nausea, vomiting, sweating, fever, difficulty breathing, cough)       None  Protocols used: Chest Pain-A-AH

## 2022-04-21 NOTE — Patient Instructions (Signed)
It was great to see you!  Our plans for today:  - Take ibuprofen 600 every 6 hours as needed for pain.  Take care and seek immediate care sooner if you develop any concerns.   Dr. Linwood Dibbles  Costochondritis  Costochondritis is inflammation of the tissue (cartilage) that connects the ribs to the breastbone (sternum). This causes pain in the front of the chest. The pain usually starts slowly and involves more than one rib. What are the causes? The exact cause of this condition is not always known. It results from stress on the cartilage where your ribs attach to your sternum. The cause of this stress could be: Chest injury. Exercise or activity, such as lifting. Severe coughing. What increases the risk? You are more likely to develop this condition if you: Are female. Are 83-45 years old. Recently started a new exercise or work activity. Have low levels of vitamin D. Have a condition that makes you cough frequently. What are the signs or symptoms? The main symptom of this condition is chest pain. The pain: Usually starts gradually and can be sharp or dull. Gets worse with deep breathing, coughing, or exercise. Gets better with rest. May be worse when you press on the affected area of your ribs and sternum. How is this diagnosed? This condition is diagnosed based on your symptoms, your medical history, and a physical exam. Your health care provider will check for pain when pressing on your sternum. You may also have tests to rule out other causes of chest pain. These may include: A chest X-ray to check for lung problems. An ECG (electrocardiogram) to see if you have a heart problem that could be causing the pain. An imaging scan to rule out a chest or rib fracture. How is this treated? This condition usually goes away on its own over time. Your health care provider may prescribe an NSAID, such as ibuprofen, to reduce pain and inflammation. Treatment may also include: Resting and  avoiding activities that make pain worse. Applying heat or ice to the area to reduce pain and inflammation. Doing exercises to stretch your chest muscles. If these treatments do not help, your health care provider may inject a numbing medicine at the sternum-rib connection to help relieve the pain. Follow these instructions at home: Managing pain, stiffness, and swelling     If directed, put ice on the painful area. To do this: Put ice in a plastic bag. Place a towel between your skin and the bag. Leave the ice on for 20 minutes, 2-3 times a day. If directed, apply heat to the affected area as often as told by your health care provider. Use the heat source that your health care provider recommends, such as a moist heat pack or a heating pad. Place a towel between your skin and the heat source. Leave the heat on for 20-30 minutes. Remove the heat if your skin turns bright red. This is especially important if you are unable to feel pain, heat, or cold. You may have a greater risk of getting burned. Activity Rest as told by your health care provider. Avoid activities that make pain worse. This includes any activities that use chest, abdominal, and side muscles. Do not lift anything that is heavier than 10 lb (4.5 kg), or the limit that you are told, until your health care provider says that it is safe. Return to your normal activities as told by your health care provider. Ask your health care provider what activities are  safe for you. General instructions Take over-the-counter and prescription medicines only as told by your health care provider. Keep all follow-up visits as told by your health care provider. This is important. Contact a health care provider if: You have chills or a fever. Your pain does not go away or it gets worse. You have a cough that does not go away. Get help right away if: You have shortness of breath. You have severe chest pain that is not relieved by medicines,  heat, or ice. These symptoms may represent a serious problem that is an emergency. Do not wait to see if the symptoms will go away. Get medical help right away. Call your local emergency services (911 in the [place] Do not drive yourself to the hospital.  Summary Costochondritis is inflammation of the tissue (cartilage) that connects the ribs to the breastbone (sternum). This condition causes pain in the front of the chest. Costochondritis results from stress on the cartilage where your ribs attach to your sternum. Treatment may include medicines, rest, heat or ice, and exercises. This information is not intended to replace advice given to you by your health care provider. Make sure you discuss any questions you have with your health care provider. Document Revised: 09/21/2019 Document Reviewed: 09/21/2019 Elsevier Patient Education  2023 [employer].

## 2022-04-27 ENCOUNTER — Ambulatory Visit: Payer: Medicaid Other | Admitting: Nurse Practitioner

## 2022-05-06 ENCOUNTER — Encounter: Payer: Medicaid Other | Admitting: Obstetrics

## 2022-05-17 ENCOUNTER — Other Ambulatory Visit: Payer: Self-pay | Admitting: Nurse Practitioner

## 2022-05-17 ENCOUNTER — Telehealth: Payer: Self-pay | Admitting: Nurse Practitioner

## 2022-05-17 DIAGNOSIS — F320 Major depressive disorder, single episode, mild: Secondary | ICD-10-CM

## 2022-05-17 DIAGNOSIS — F419 Anxiety disorder, unspecified: Secondary | ICD-10-CM

## 2022-05-17 NOTE — Telephone Encounter (Signed)
Pt states that she wants to resume taking Zoloft, the last time she had side effects they subsided after 20 minutes. Only the nausea returned that evening. Please advise, pt says she is willing to come in for an OV. Says she is going to call back for an appt just needs to speak to mother first  [facility] [place], [place] - [address]  [address] [place] [address]  Phone: [phone] Fax: [phone]

## 2022-05-20 ENCOUNTER — Encounter: Payer: Self-pay | Admitting: Nurse Practitioner

## 2022-05-20 ENCOUNTER — Ambulatory Visit (INDEPENDENT_AMBULATORY_CARE_PROVIDER_SITE_OTHER): Payer: Medicaid Other | Admitting: Nurse Practitioner

## 2022-05-20 VITALS — BP 128/74 | HR 98 | Temp 98.6°F | Resp 16 | Ht 63.0 in | Wt 117.9 lb

## 2022-05-20 DIAGNOSIS — F411 Generalized anxiety disorder: Secondary | ICD-10-CM

## 2022-05-20 DIAGNOSIS — F320 Major depressive disorder, single episode, mild: Secondary | ICD-10-CM | POA: Diagnosis not present

## 2022-05-20 MED ORDER — BUSPIRONE HCL 5 MG PO TABS: 5.0000 mg | ORAL_TABLET | Freq: Three times a day (TID) | ORAL | 0 refills | Status: DC | PRN

## 2022-05-20 MED ORDER — ESCITALOPRAM OXALATE 10 MG PO TABS: 10.0000 mg | ORAL_TABLET | Freq: Every day | ORAL | 0 refills | Status: DC

## 2022-05-20 NOTE — Assessment & Plan Note (Signed)
She reports that she has been really depressed and anxious lately.  We previously had tried Zoloft patient did not tolerate medication.  We will try Lexapro 10 mg daily and BuSpar 5 mg TID PRN .  Referral placed to psychiatry.  Patient is aware to follow-up in 4 weeks.

## 2022-05-20 NOTE — Progress Notes (Signed)
BP 128/74   Pulse 98   Temp 98.6 F (37 C) (Oral)   Resp 16   Ht _0  (1.6 m)   Wt 117 lb 14.4 oz (53.5 kg)   SpO2 98%   BMI 20.89 kg/m    Subjective:    Patient ID: Sherry Floyd, female    DOB: 07/27/2001, 21 y.o.   MRN: [id number]  HPI: Sherry Floyd is a 21 y.o. female  Chief Complaint  Patient presents with   Follow-up    Pt been feeling depressed   Anxiety   Depression/anxiety: Patient states she has been having a lot of anxiety and depression.  Patient reports her anxiety has gotten significantly worse.  We did previous treated with zoloft but she had a reaction to it.  She would like to try something else. Discussed choices with patient, she would like Lexapro and BuSpar.  Patient is also not currently in counseling information provided.  Discussed seeing psychiatry and patient would like to do that.  Referral placed.  PHQ-9 and GAD scores are positive patient denies any suicidal thoughts.     05/20/2022    3:32 PM 04/21/2022    2:30 PM 03/16/2022    2:29 PM 03/16/2022    2:12 PM  Depression screen PHQ 2/9  Decreased Interest 1 0 2 2  Down, Depressed, Hopeless 1 0 2 2  PHQ - 2 Score 2 0 4 4  Altered sleeping 0  0   Tired, decreased energy 0  1 1  Change in appetite _1 Feeling bad or failure about yourself  1  0 0  Trouble concentrating _2 Moving slowly or fidgety/restless 1  0 0  Suicidal thoughts 0  0 0  PHQ-9 Score 6  7   Difficult doing work/chores Somewhat difficult  Somewhat difficult Somewhat difficult       05/20/2022    3:33 PM 03/16/2022    2:13 PM  GAD 7 : Generalized Anxiety Score  Nervous, Anxious, on Edge 1 2  Control/stop worrying 1 1  Worry too much - different things 1 1  Trouble relaxing 1 0  Restless 1 0  Easily annoyed or irritable 1 3  Afraid - awful might happen 1 0  Total GAD 7 Score 7 7  Anxiety Difficulty Somewhat difficult Somewhat difficult     Relevant past medical, surgical, family and social history  reviewed and updated as indicated. Interim medical history since our last visit reviewed. Allergies and medications reviewed and updated.  Review of Systems  Constitutional: Negative for fever or weight change.  Respiratory: Negative for cough and shortness of breath.   Cardiovascular: Negative for chest pain or palpitations.  Gastrointestinal: Negative for abdominal pain, no bowel changes.  Musculoskeletal: Negative for gait problem or joint swelling.  Skin: Negative for rash.  Neurological: Negative for dizziness or headache.  No other specific complaints in a complete review of systems (except as listed in HPI above).      Objective:    BP 128/74   Pulse 98   Temp 98.6 F (37 C) (Oral)   Resp 16   Ht _3  (1.6 m)   Wt 117 lb 14.4 oz (53.5 kg)   SpO2 98%   BMI 20.89 kg/m   Wt Readings from Last 3 Encounters:  05/20/22 117 lb 14.4 oz (53.5 kg)  04/21/22 122 lb (55.3 kg)  03/16/22 118 lb 8 oz (53.8 kg)  Physical Exam  Constitutional: Patient appears well-developed and well-nourished. No distress.  HEENT: head atraumatic, normocephalic, pupils equal and reactive to light, neck supple Cardiovascular: Normal rate, regular rhythm and normal heart sounds.  No murmur heard. No BLE edema. Pulmonary/Chest: Effort normal and breath sounds normal. No respiratory distress. Abdominal: Soft.  There is no tenderness. Psychiatric: Patient has a anxious mood and affect. behavior is normal. Judgment and thought content normal.  Results for orders placed or performed in visit on 03/16/22  CBC with Differential/Platelet  Result Value Ref Range   WBC 8.0 3.8 - 10.8 Thousand/uL   RBC 5.59 (H) 3.80 - 5.10 Million/uL   Hemoglobin 15.7 (H) 11.7 - 15.5 g/dL   HCT 48.6 (H) 35.0 - 45.0 %   MCV 86.9 80.0 - 100.0 fL   MCH 28.1 27.0 - 33.0 pg   MCHC 32.3 32.0 - 36.0 g/dL   RDW 12.7 11.0 - 15.0 %   Platelets 248 140 - 400 Thousand/uL   MPV 12.6 (H) 7.5 - 12.5 fL   Neutro Abs 5,496 1,500 -  7,800 cells/uL   Lymphs Abs 1,832 850 - 3,900 cells/uL   Absolute Monocytes 592 200 - 950 cells/uL   Eosinophils Absolute 40 15 - 500 cells/uL   Basophils Absolute 40 0 - 200 cells/uL   Neutrophils Relative % 68.7 %   Total Lymphocyte 22.9 %   Monocytes Relative 7.4 %   Eosinophils Relative 0.5 %   Basophils Relative 0.5 %  COMPLETE METABOLIC PANEL WITH GFR  Result Value Ref Range   Glucose, Bld 84 65 - 99 mg/dL   BUN 9 7 - 25 mg/dL   Creat 0.73 0.50 - 0.96 mg/dL   eGFR 120 > OR = 60 mL/min/1.45m   BUN/Creatinine Ratio NOT APPLICABLE 6 - 22 (calc)   Sodium 145 135 - 146 mmol/L   Potassium 4.9 3.5 - 5.3 mmol/L   Chloride 106 98 - 110 mmol/L   CO2 27 20 - 32 mmol/L   Calcium 10.6 (H) 8.6 - 10.2 mg/dL   Total Protein 7.2 6.1 - 8.1 g/dL   Albumin 4.9 3.6 - 5.1 g/dL   Globulin 2.3 1.9 - 3.7 g/dL (calc)   AG Ratio 2.1 1.0 - 2.5 (calc)   Total Bilirubin 0.8 0.2 - 1.2 mg/dL   Alkaline phosphatase (APISO) 80 31 - 125 U/L   AST 14 10 - 30 U/L   ALT 11 6 - 29 U/L  TSH  Result Value Ref Range   TSH 0.80 mIU/L  HIV Antibody (routine testing w rflx)  Result Value Ref Range   HIV 1&2 Ab, 4th Generation NON-REACTIVE NON-REACTIVE  Hepatitis C antibody  Result Value Ref Range   Hepatitis C Ab NON-REACTIVE NON-REACTIVE   SIGNAL TO CUT-OFF 0.12 <1.00  RPR  Result Value Ref Range   RPR Ser Ql NON-REACTIVE NON-REACTIVE  POCT urine pregnancy  Result Value Ref Range   Preg Test, Ur Negative Negative  POCT urinalysis dipstick  Result Value Ref Range   Color, UA negative    Clarity, UA cloudy    Glucose, UA Negative Negative   Bilirubin, UA negative    Ketones, UA negative    Spec Grav, UA 1.020 1.010 - 1.025   Blood, UA small    pH, UA 6.0 5.0 - 8.0   Protein, UA Positive (A) Negative   Urobilinogen, UA 0.2 0.2 or 1.0 E.U./dL   Nitrite, UA negative    Leukocytes, UA Trace (A) Negative   Appearance  cloudy    Odor none   Urine cytology ancillary only  Result Value Ref Range    Neisseria Gonorrhea Negative    Chlamydia Negative    Trichomonas Negative    Comment Normal Reference Ranger Chlamydia - Negative    Comment      Normal Reference Range Neisseria Gonorrhea - Negative   Comment Normal Reference Range Trichomonas - Negative       Assessment & Plan:   Problem List Items Addressed This Visit       Other   Current mild episode of major depressive disorder without prior episode ([place])    She reports that she has been really depressed and anxious lately.  We previously had tried Zoloft patient did not tolerate medication.  We will try Lexapro 10 mg daily and BuSpar 5 mg TID PRN .  Referral placed to psychiatry.  Patient is aware to follow-up in 4 weeks.      Relevant Medications   escitalopram (LEXAPRO) 10 MG tablet   busPIRone (BUSPAR) 5 MG tablet   Other Relevant Orders   Ambulatory referral to Psychiatry   Generalized anxiety disorder - Primary    She reports that she has been really depressed and anxious lately.  We previously had tried Zoloft patient did not tolerate medication.  We will try Lexapro 10 mg daily and BuSpar 5 mg TID PRN .  Referral placed to psychiatry.  Patient is aware to follow-up in 4 weeks.      Relevant Medications   escitalopram (LEXAPRO) 10 MG tablet   busPIRone (BUSPAR) 5 MG tablet   Other Relevant Orders   Ambulatory referral to Psychiatry     Follow up plan: Return in about 4 weeks (around 06/17/2022) for follow up.

## 2022-05-20 NOTE — Assessment & Plan Note (Signed)
She reports that she has been really depressed and anxious lately.  We previously had tried Zoloft patient did not tolerate medication.  We will try Lexapro 10 mg daily and BuSpar 5 mg TID PRN .  Referral placed to psychiatry.  Patient is aware to follow-up in 4 weeks. 

## 2022-05-21 ENCOUNTER — Encounter: Payer: Self-pay | Admitting: Nurse Practitioner

## 2022-05-21 ENCOUNTER — Other Ambulatory Visit: Payer: Self-pay | Admitting: Nurse Practitioner

## 2022-05-21 DIAGNOSIS — R110 Nausea: Secondary | ICD-10-CM

## 2022-05-21 DIAGNOSIS — J301 Allergic rhinitis due to pollen: Secondary | ICD-10-CM

## 2022-05-21 MED ORDER — ONDANSETRON HCL 4 MG PO TABS: 4.0000 mg | ORAL_TABLET | Freq: Three times a day (TID) | ORAL | 0 refills | Status: DC | PRN

## 2022-05-21 MED ORDER — FLUTICASONE PROPIONATE 50 MCG/ACT NA SUSP: 2.0000 | Freq: Every day | NASAL | 6 refills | Status: DC

## 2022-05-28 ENCOUNTER — Encounter: Payer: Self-pay | Admitting: Nurse Practitioner

## 2022-06-03 ENCOUNTER — Ambulatory Visit (INDEPENDENT_AMBULATORY_CARE_PROVIDER_SITE_OTHER): Payer: Medicaid Other

## 2022-06-03 DIAGNOSIS — Z3009 Encounter for other general counseling and advice on contraception: Secondary | ICD-10-CM | POA: Diagnosis not present

## 2022-06-03 MED ORDER — MEDROXYPROGESTERONE ACETATE 150 MG/ML IM SUSP
150.0000 mg | Freq: Once | INTRAMUSCULAR | Status: AC
  Administered 2022-06-03: 150 mg via INTRAMUSCULAR

## 2022-06-10 ENCOUNTER — Ambulatory Visit (INDEPENDENT_AMBULATORY_CARE_PROVIDER_SITE_OTHER): Payer: Medicaid Other | Admitting: Obstetrics

## 2022-06-10 ENCOUNTER — Other Ambulatory Visit (HOSPITAL_COMMUNITY)
Admission: RE | Admit: 2022-06-10 | Discharge: 2022-06-10 | Disposition: A | Discharge status: Home / Self Care | Payer: Medicaid Other | Source: Ambulatory Visit | Attending: Obstetrics | Admitting: Obstetrics

## 2022-06-10 ENCOUNTER — Encounter: Payer: Self-pay | Admitting: Obstetrics

## 2022-06-10 VITALS — BP 107/62 | HR 89 | Ht 63.0 in | Wt 117.0 lb

## 2022-06-10 DIAGNOSIS — Z124 Encounter for screening for malignant neoplasm of cervix: Secondary | ICD-10-CM | POA: Diagnosis present

## 2022-06-10 DIAGNOSIS — Z0000 Encounter for general adult medical examination without abnormal findings: Secondary | ICD-10-CM

## 2022-06-10 DIAGNOSIS — Z01419 Encounter for gynecological examination (general) (routine) without abnormal findings: Secondary | ICD-10-CM | POA: Diagnosis not present

## 2022-06-10 NOTE — Progress Notes (Signed)
SUBJECTIVE  HPI  Sherry OFFNER is a 21 y.o.-year-old female who presents for an gynecological exam and Pap smear today.  She denies pelvic pain, dyspareunia, abnormal bleeding or discharge, and UTI symptoms. She is currently sexually active with one female partner. She reports anxiety that is well-managed on medication. She states that her ear is itchy but has no other health concerns.   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 No pertinent surgical history.  Social History  Work: starting job as Programme researcher, broadcasting/film/video at [employer] Exercise: walks dogs Substances: denies EtOH, tobacco, vape, and recreational drugs  Obstetric History OB History     Gravida  0   Para  0   Term  0   Preterm  0   AB  0   Living  0      SAB  0   IAB  0   Ectopic  0   Multiple  0   Live Births  0            GYN/Menstrual History No LMP recorded (lmp unknown). Patient has had an injection. Does not have periods on Depo Last Pap: this will be her first Pap Contraception: Depo  Prevention Mammogram at 40 Colonscopy at 34  Current Medications Outpatient Medications Prior to Visit  Medication Sig   busPIRone (BUSPAR) 5 MG tablet Take 1 tablet (5 mg total) by mouth 3 (three) times daily as needed.   escitalopram (LEXAPRO) 10 MG tablet Take 1 tablet (10 mg total) by mouth daily.   fluticasone (FLONASE) 50 MCG/ACT nasal spray Place 2 sprays into both nostrils daily.   medroxyPROGESTERone Acetate 150 MG/ML SUSY Inject into the muscle.   ondansetron (ZOFRAN) 4 MG tablet Take 1 tablet (4 mg total) by mouth every 8 (eight) hours as needed for nausea or vomiting.   No facility-administered medications prior to visit.      Upstream - 06/10/22 0930       Contraception Wrap Up   Current Method Hormonal Injection    End  Method Hormonal Injection    Contraception Counseling Provided No            The pregnancy intention screening data noted above was reviewed. Potential methods of contraception were discussed. The patient elected to proceed with Hormonal Injection.   ROS History obtained from the patient General ROS: negative for - chills, fatigue, fever, hot flashes, or malaise Psychological ROS: positive for - anxiety ENT ROS: positive for - itchy ear negative for - headaches or sore throat Hematological and Lymphatic ROS: negative for - swollen lymph nodes Endocrine ROS: negative for - palpitations or polydipsia/polyuria Breast ROS: negative for breast lumps Respiratory ROS: no cough, shortness of breath, or wheezing Cardiovascular ROS: no chest pain or dyspnea on exertion Gastrointestinal ROS: no abdominal pain, change in bowel habits, or black or bloody stools positive for - diarrhea Genito-Urinary ROS: no dysuria, trouble voiding, or hematuria Musculoskeletal ROS: negative Dermatological ROS: negative     05/20/2022    3:32 PM 04/21/2022    2:30 PM 03/16/2022    2:29 PM 03/16/2022    2:12 PM  Depression screen PHQ 2/9  Decreased Interest 1 0 2 2  Down, Depressed, Hopeless 1 0 2 2  PHQ - 2 Score 2 0 4 4  Altered sleeping  0  0   Tired, decreased energy 0  1 1  Change in appetite _0 Feeling bad or failure about yourself  1  0 0  Trouble concentrating _1 Moving slowly or fidgety/restless 1  0 0  Suicidal thoughts 0  0 0  PHQ-9 Score 6  7   Difficult doing work/chores Somewhat difficult  Somewhat difficult Somewhat difficult     OBJECTIVE  Last Weight  Most recent update: 06/10/2022  8:47 AM    Weight  53.1 kg (117 lb)             Body mass index is 20.73 kg/m.    BP 107/62   Pulse 89   Ht 5' 3" (1.6 m)   Wt 117 lb (53.1 kg)   LMP  (LMP Unknown) Comment: has not had menstrual only break threw bleading  BMI 20.73 kg/m  General appearance: alert, cooperative,  and appears stated age Head: Normocephalic, without obvious abnormality, atraumatic Eyes: conjunctivae/corneas clear. PERRL, EOM's intact. Fundi benign. Ears:  significant wax buildup bilaterally, unable to visualize tympanic membrane Neck: no adenopathy, supple, symmetrical, trachea midline, and thyroid not enlarged, symmetric, no tenderness/mass/nodules Lungs: clear to auscultation bilaterally Breasts: normal appearance, no masses or tenderness, Inspection negative, No nipple retraction or dimpling, No axillary or supraclavicular adenopathy, Normal to palpation without dominant masses, Taught monthly breast self examination Heart: regular rate and rhythm, S1, S2 normal, no murmur, click, rub or gallop Abdomen: soft, non-tender; bowel sounds normal; no masses,  no organomegaly Pelvic: cervix normal in appearance, external genitalia normal, no cervical motion tenderness, rectovaginal septum normal, vagina normal without discharge, and Pap collected. Contact bleeding noted. Extremities: extremities normal, atraumatic, no cyanosis or edema Pulses: 2+ and symmetric Skin: Skin color, texture, turgor normal. No rashes or lesions Lymph nodes: Cervical, supraclavicular, and axillary nodes normal.  ASSESSMENT  1) Annual exam 2) Pap due 3) Pruritis in left ear canal  PLAN 1) Physical exam as noted. Discussed healthy lifestyle choices, daily multivitamin. Declines STI testing. Routine labs done with PCP. 2) Pap collected. F/u based on results. 3) Encouraged ear wax removal kit. See PCP if no improvement.  Return in one year for annual exam or as needed for concerns.   Lloyd Huger, CNM

## 2022-06-11 ENCOUNTER — Encounter: Payer: Self-pay | Admitting: Obstetrics

## 2022-06-11 LAB — CYTOLOGY - PAP: Diagnosis: NEGATIVE

## 2022-06-14 ENCOUNTER — Other Ambulatory Visit: Payer: Self-pay | Admitting: Emergency Medicine

## 2022-06-14 DIAGNOSIS — F320 Major depressive disorder, single episode, mild: Secondary | ICD-10-CM

## 2022-06-14 DIAGNOSIS — F411 Generalized anxiety disorder: Secondary | ICD-10-CM

## 2022-06-17 ENCOUNTER — Encounter: Payer: Self-pay | Admitting: Nurse Practitioner

## 2022-06-17 ENCOUNTER — Ambulatory Visit (INDEPENDENT_AMBULATORY_CARE_PROVIDER_SITE_OTHER): Payer: Medicaid Other | Admitting: Nurse Practitioner

## 2022-06-17 VITALS — BP 110/72 | HR 87 | Temp 98.0°F | Resp 16 | Ht 63.0 in | Wt 113.4 lb

## 2022-06-17 DIAGNOSIS — F320 Major depressive disorder, single episode, mild: Secondary | ICD-10-CM | POA: Diagnosis not present

## 2022-06-17 DIAGNOSIS — F411 Generalized anxiety disorder: Secondary | ICD-10-CM | POA: Diagnosis not present

## 2022-06-17 DIAGNOSIS — J3089 Other allergic rhinitis: Secondary | ICD-10-CM | POA: Diagnosis not present

## 2022-06-17 MED ORDER — BUSPIRONE HCL 7.5 MG PO TABS: 7.5000 mg | ORAL_TABLET | Freq: Three times a day (TID) | ORAL | 0 refills | Status: DC | PRN

## 2022-06-17 MED ORDER — CETIRIZINE HCL 10 MG PO TABS: 10.0000 mg | ORAL_TABLET | Freq: Every day | ORAL | 11 refills | Status: DC

## 2022-06-17 MED ORDER — ESCITALOPRAM OXALATE 20 MG PO TABS: 20.0000 mg | ORAL_TABLET | Freq: Every day | ORAL | 0 refills | Status: DC

## 2022-06-17 NOTE — Assessment & Plan Note (Signed)
Anxiety and depression improving.  Increase Lexapro to 20 mg daily.  Increase BuSpar to 7.5 mg 3 times a day as needed.  Return for follow-up in 4 weeks.

## 2022-06-17 NOTE — Assessment & Plan Note (Addendum)
Anxiety and depression improving.  Increase Lexapro to 20 mg daily.  Increase BuSpar to 7.5 mg 3 times a day as needed.  Return for follow-up in 4 weeks. 

## 2022-06-17 NOTE — Progress Notes (Signed)
BP 110/72   Pulse 87   Temp 98 F (36.7 C) (Oral)   Resp 16   Ht 5\' 3"  (1.6 m)   Wt 113 lb 6.4 oz (51.4 kg)   LMP  (LMP Unknown) Comment: has not had menstrual only break threw bleading  SpO2 99%   BMI 20.09 kg/m    Subjective:    Patient ID: , female    DOB: October 11, 2001, 21 y.o.   MRN: [id number]  HPI: Sherry Floyd is a 21 y.o. female  Chief Complaint  Patient presents with   Follow-up   Depression   Anxiety   Depression/anxiety: Patient is currently taking Lexapro 10 mg daily and buspar 5 mg three times a day as needed.  Patient reports she has definitely noticed an improvement.  Patient states she would like to go up on the dose.  We will change dose Lexapro 20 mg daily and BuSpar 7.5 mg 3 times a day as needed.  She is in agreement with plan.    06/17/2022    1:44 PM 05/20/2022    3:32 PM 04/21/2022    2:30 PM 03/16/2022    2:29 PM 03/16/2022    2:12 PM  Depression screen PHQ 2/9  Decreased Interest 1 1 0 2 2  Down, Depressed, Hopeless 0 1 0 2 2  PHQ - 2 Score 1 2 0 4 4  Altered sleeping 1 0  0   Tired, decreased energy 0 0  1 1  Change in appetite 0 1  1 1   Feeling bad or failure about yourself  0 1  0 0  Trouble concentrating 0 1  1 1   Moving slowly or fidgety/restless 0 1  0 0  Suicidal thoughts 0 0  0 0  PHQ-9 Score 2 6  7    Difficult doing work/chores Not difficult at all Somewhat difficult  Somewhat difficult Somewhat difficult       06/17/2022    1:45 PM 05/20/2022    3:33 PM 03/16/2022    2:13 PM  GAD 7 : Generalized Anxiety Score  Nervous, Anxious, on Edge 0 1 2  Control/stop worrying 0 1 1  Worry too much - different things 0 1 1  Trouble relaxing 0 1 0  Restless 0 1 0  Easily annoyed or irritable 0 1 3  Afraid - awful might happen 0 1 0  Total GAD 7 Score 0 7 7  Anxiety Difficulty Not difficult at all Somewhat difficult Somewhat difficult   Allergic rhinitis: Patient is reporting nasal stuffiness and itchy ears for a while.   Discussed starting an allergy medication.  Patient is going to trial Zyrtec and see if that helps with her symptoms.  Relevant past medical, surgical, family and social history reviewed and updated as indicated. Interim medical history since our last visit reviewed. Allergies and medications reviewed and updated.  Review of Systems  Constitutional: Negative for fever or weight change.  Respiratory: Negative for cough and shortness of breath.   Cardiovascular: Negative for chest pain or palpitations.  Gastrointestinal: Negative for abdominal pain, no bowel changes.  Musculoskeletal: Negative for gait problem or joint swelling.  Skin: Negative for rash.  Neurological: Negative for dizziness or headache.  No other specific complaints in a complete review of systems (except as listed in HPI above).      Objective:    BP 110/72   Pulse 87   Temp 98 F (36.7 C) (Oral)   Resp 16  Ht 5\' 3"  (1.6 m)   Wt 113 lb 6.4 oz (51.4 kg)   LMP  (LMP Unknown) Comment: has not had menstrual only break threw bleading  SpO2 99%   BMI 20.09 kg/m   Wt Readings from Last 3 Encounters:  06/17/22 113 lb 6.4 oz (51.4 kg)  06/10/22 117 lb (53.1 kg)  05/20/22 117 lb 14.4 oz (53.5 kg)    Physical Exam Constitutional: Patient appears well-developed and well-nourished.  No distress.  HEENT: head atraumatic, normocephalic, pupils equal and reactive to light, ears TMs clear,nostrils clear, neck supple, throat within normal limits Cardiovascular: Normal rate, regular rhythm and normal heart sounds.  No murmur heard. No BLE edema. Pulmonary/Chest: Effort normal and breath sounds normal. No respiratory distress. Abdominal: Soft.  There is no tenderness. Psychiatric: Patient has a normal mood and affect. behavior is normal. Judgment and thought content normal.  Results for orders placed or performed in visit on 06/10/22  Cytology - PAP  Result Value Ref Range   Adequacy      Satisfactory for evaluation;  transformation zone component PRESENT.   Diagnosis      - Negative for intraepithelial lesion or malignancy (NILM)      Assessment & Plan:   Problem List Items Addressed This Visit       Other   Current mild episode of major depressive disorder without prior episode ([facility])    Anxiety and depression improving.  Increase Lexapro to 20 mg daily.  Increase BuSpar to 7.5 mg 3 times a day as needed.  Return for follow-up in 4 weeks.      Relevant Medications   busPIRone (BUSPAR) 7.5 MG tablet   escitalopram (LEXAPRO) 20 MG tablet   Generalized anxiety disorder - Primary    Anxiety and depression improving.  Increase Lexapro to 20 mg daily.  Increase BuSpar to 7.5 mg 3 times a day as needed.  Return for follow-up in 4 weeks.      Relevant Medications   busPIRone (BUSPAR) 7.5 MG tablet   escitalopram (LEXAPRO) 20 MG tablet   Other Visit Diagnoses     Allergic rhinitis due to other allergic trigger, unspecified seasonality       Start taking Zyrtec daily.     Relevant Medications   cetirizine (ZYRTEC) 10 MG tablet        Follow up plan: Return in about 4 weeks (around 07/15/2022) for follow up.

## 2022-06-18 ENCOUNTER — Encounter: Payer: Self-pay | Admitting: Nurse Practitioner

## 2022-06-21 ENCOUNTER — Other Ambulatory Visit: Payer: Self-pay | Admitting: Nurse Practitioner

## 2022-06-21 DIAGNOSIS — F411 Generalized anxiety disorder: Secondary | ICD-10-CM

## 2022-07-15 ENCOUNTER — Encounter: Payer: Self-pay | Admitting: Nurse Practitioner

## 2022-07-15 ENCOUNTER — Ambulatory Visit (INDEPENDENT_AMBULATORY_CARE_PROVIDER_SITE_OTHER): Payer: Medicaid Other | Admitting: Nurse Practitioner

## 2022-07-15 ENCOUNTER — Other Ambulatory Visit: Payer: Self-pay

## 2022-07-15 DIAGNOSIS — F320 Major depressive disorder, single episode, mild: Secondary | ICD-10-CM | POA: Diagnosis not present

## 2022-07-15 DIAGNOSIS — F411 Generalized anxiety disorder: Secondary | ICD-10-CM

## 2022-07-15 MED ORDER — ESCITALOPRAM OXALATE 20 MG PO TABS: 20.0000 mg | ORAL_TABLET | Freq: Every day | ORAL | 0 refills | Status: DC

## 2022-07-15 NOTE — Assessment & Plan Note (Addendum)
Continue taking Lexapro 20 mg daily.  .  Discontinue buspar. Reach out to [facility] for appointment

## 2022-07-15 NOTE — Progress Notes (Signed)
BP 118/70   Pulse 98   Temp 98.6 F (37 C) (Oral)   Ht 5\' 3"  (1.6 m)   Wt 121 lb 14.4 oz (55.3 kg)   SpO2 98%   BMI 21.59 kg/m    Subjective:    Patient ID: , female    DOB: 21-Jul-2001, 21 y.o.   MRN: [id number]  HPI: Sherry Floyd is a 21 y.o. female  Chief Complaint  Patient presents with   Depression   Anxiety    4 week recheck   Depression/anxiety: Last appointment patient was increased to Lexapro 20 mg daily and buspar was increased to 7.5 mg 3 times a day as needed. She says that the buspar makes her feel dizzy.  Discussed discontinuing the buspar and continuing with the lexapro. Referral was placed to psychiatry per patient request. Patient had made an appointment but has since cancelled it. She says she is going to go through [facility] instead.       07/15/2022    1:51 PM 06/17/2022    1:44 PM 05/20/2022    3:32 PM 04/21/2022    2:30 PM 03/16/2022    2:29 PM  Depression screen PHQ 2/9  Decreased Interest 0 1 1 0 2  Down, Depressed, Hopeless 1 0 1 0 2  PHQ - 2 Score 1 1 2  0 4  Altered sleeping 0 1 0  0  Tired, decreased energy 1 0 0  1  Change in appetite 0 0 1  1  Feeling bad or failure about yourself  0 0 1  0  Trouble concentrating 0 0 1  1  Moving slowly or fidgety/restless 0 0 1  0  Suicidal thoughts 0 0 0  0  PHQ-9 Score 2 2 6  7   Difficult doing work/chores Not difficult at all Not difficult at all Somewhat difficult  Somewhat difficult       07/15/2022    1:51 PM 06/17/2022    1:45 PM 05/20/2022    3:33 PM 03/16/2022    2:13 PM  GAD 7 : Generalized Anxiety Score  Nervous, Anxious, on Edge 1 0 1 2  Control/stop worrying 0 0 1 1  Worry too much - different things 0 0 1 1  Trouble relaxing 0 0 1 0  Restless 0 0 1 0  Easily annoyed or irritable 1 0 1 3  Afraid - awful might happen 0 0 1 0  Total GAD 7 Score 2 0 7 7  Anxiety Difficulty Not difficult at all Not difficult at all Somewhat difficult Somewhat difficult    Relevant past  medical, surgical, family and social history reviewed and updated as indicated. Interim medical history since our last visit reviewed. Allergies and medications reviewed and updated.  Review of Systems  Constitutional: Negative for fever or weight change.  Respiratory: Negative for cough and shortness of breath.   Cardiovascular: Negative for chest pain or palpitations.  Gastrointestinal: Negative for abdominal pain, no bowel changes.  Musculoskeletal: Negative for gait problem or joint swelling.  Skin: Negative for rash.  Neurological: Negative for dizziness or headache.  No other specific complaints in a complete review of systems (except as listed in HPI above).      Objective:    BP 118/70   Pulse 98   Temp 98.6 F (37 C) (Oral)   Ht 5\' 3"  (1.6 m)   Wt 121 lb 14.4 oz (55.3 kg)   SpO2 98%   BMI 21.59 kg/m  Wt Readings from Last 3 Encounters:  07/15/22 121 lb 14.4 oz (55.3 kg)  06/17/22 113 lb 6.4 oz (51.4 kg)  06/10/22 117 lb (53.1 kg)    Physical Exam Constitutional: Patient appears well-developed and well-nourished.  No distress.  HEENT: head atraumatic, normocephalic, pupils equal and reactive to light, ears TMs clear,nostrils clear, neck supple, throat within normal limits Cardiovascular: Normal rate, regular rhythm and normal heart sounds.  No murmur heard. No BLE edema. Pulmonary/Chest: Effort normal and breath sounds normal. No respiratory distress. Abdominal: Soft.  There is no tenderness. Psychiatric: Patient has a normal mood and affect. behavior is normal. Judgment and thought content normal.      Assessment & Plan:   Problem List Items Addressed This Visit       Other   Current mild episode of major depressive disorder without prior episode (H[facility])    Continue taking Lexapro 20 mg daily.  .  Discontinue buspar. Reach out to [facility] for appointment      Relevant Medications   escitalopram (LEXAPRO) 20 MG tablet   Generalized anxiety disorder    Continue  taking Lexapro 20 mg daily.  Discontinue buspar. Reach out to [facility] for appointment      Relevant Medications   escitalopram (LEXAPRO) 20 MG tablet     Follow up plan: Return in about 3 months (around 10/15/2022) for follow up.

## 2022-07-15 NOTE — Assessment & Plan Note (Addendum)
Continue taking Lexapro 20 mg daily.  .  Discontinue buspar. Reach out to [facility] for appointment 

## 2022-07-21 ENCOUNTER — Ambulatory Visit: Payer: Self-pay | Admitting: *Deleted

## 2022-07-21 NOTE — Telephone Encounter (Signed)
Summary: Questions about the depo shot   Pt has questions about the depo shot, wants to stop receiving them. Wants to speak to a nurse     Attempted to call patient- no answer and voice mail not set up

## 2022-07-21 NOTE — Telephone Encounter (Signed)
Summary: Questions about the depo shot    Pt has questions about the depo shot, wants to stop receiving them. Wants to speak to a nurse     Attempted to call patient- no answer and voice mail not set up Wanting to come off Depo shot and needs to know what precautions and if back up birth control is needed. Advised pt to use back up birth control. Advised pt that will route note to her provider for any further instructions.  Reason for Disposition  [1] Caller has NON-URGENT medicine question about med that PCP prescribed AND [2] triager unable to answer question  Answer Assessment - Initial Assessment Questions 1. REASON FOR CALL or QUESTION: "What is your reason for calling today?" or "How can I best help you?" or "What question do you have that I can help answer?"     Pt wants to come of Depo Shot what does she need to do.  Answer Assessment - Initial Assessment Questions 1. NAME of MEDICINE: "What medicine(s) are you calling about?"     Depo shot 2. QUESTION: "What is your question?" (e.g., double dose of medicine, side effect)     Wants to discontinue  3. PRESCRIBER: "Who prescribed the medicine?" Reason: if prescribed by specialist, call should be referred to that group.     PCP 4. SYMPTOMS: "Do you have any symptoms?" If Yes, ask: "What symptoms are you having?"  "How bad are the symptoms (e.g., mild, moderate, severe)     none 5. PREGNANCY:  "Is there any chance that you are pregnant?" "When was your last menstrual period?"     N/a  Protocols used: Information Only Call - No Triage-[facility], Medication Question Call-[facility]

## 2022-07-21 NOTE — Telephone Encounter (Signed)
Spoke to patient and she decided she will keep depo

## 2022-08-12 ENCOUNTER — Encounter: Payer: Self-pay | Admitting: Nurse Practitioner

## 2022-08-18 ENCOUNTER — Encounter: Payer: Self-pay | Admitting: Nurse Practitioner

## 2022-08-19 ENCOUNTER — Ambulatory Visit (INDEPENDENT_AMBULATORY_CARE_PROVIDER_SITE_OTHER): Payer: Medicaid Other | Admitting: Nurse Practitioner

## 2022-08-19 ENCOUNTER — Encounter: Payer: Self-pay | Admitting: Nurse Practitioner

## 2022-08-19 ENCOUNTER — Other Ambulatory Visit: Payer: Self-pay

## 2022-08-19 VITALS — BP 110/74 | HR 94 | Temp 99.1°F | Resp 18 | Ht 63.0 in | Wt 121.9 lb

## 2022-08-19 DIAGNOSIS — F5105 Insomnia due to other mental disorder: Secondary | ICD-10-CM | POA: Diagnosis not present

## 2022-08-19 DIAGNOSIS — F99 Mental disorder, not otherwise specified: Secondary | ICD-10-CM

## 2022-08-19 DIAGNOSIS — H6121 Impacted cerumen, right ear: Secondary | ICD-10-CM

## 2022-08-19 MED ORDER — ZOLPIDEM TARTRATE 5 MG PO TABS: 5.0000 mg | ORAL_TABLET | Freq: Every evening | ORAL | 1 refills | Status: DC | PRN

## 2022-08-19 NOTE — Progress Notes (Signed)
BP 110/74   Pulse 94   Temp 99.1 F (37.3 C) (Oral)   Resp 18   Ht 5\' 3"  (1.6 m)   Wt 121 lb 14.4 oz (55.3 kg)   SpO2 99%   BMI 21.59 kg/m    Subjective:    Patient ID: Sherry Floyd, female    DOB: 21-Jan-2001, 21 y.o.   MRN: [id number]  HPI: Sherry Floyd is a 21 y.o. female  Chief Complaint  Patient presents with   Insomnia   Insomnia: patient reports she has been having trouble sleeping at night she says it has gotten worse over the last two months.  She says when she was a child she was on klonopin to help her sleep.  Patient states she has tried melatonin but it does not help.  She reports she has a hard time falling asleep and staying asleep.  She would like to try Ambien.  Discussed side effects.  Recommend life style modification that includes no cell phone use one hour before bed, and no caffeine in the afternoon.  Right ear cerumen impaction: Patient reports she has felt like her right ear is clogged for a few days. Upon inspection right ear canal is impacted with cerumen.  Discussed ear lavage and patient would like to have that done.  Her ear lavage TM visualized no redness noted.  Patient still has a little bit of wax noted in the ear canal.  Discussed with patient putting a couple drops of warm water and hydrogen peroxide in her ear every night.  Discussed with patient not putting things inside her ear. Verbal consent given Possible side effects discussed with patient Ears were  lavaged with warm water and peroxide  Patient tolerated procedure well No complications     Relevant past medical, surgical, family and social history reviewed and updated as indicated. Interim medical history since our last visit reviewed. Allergies and medications reviewed and updated.  Review of Systems  Constitutional: Negative for fever or weight change.  HEENT: right ear fullness Respiratory: Negative for cough and shortness of breath.   Cardiovascular: Negative for chest  pain or palpitations.  Gastrointestinal: Negative for abdominal pain, no bowel changes.  Musculoskeletal: Negative for gait problem or joint swelling.  Skin: Negative for rash.  Neurological: Negative for dizziness or headache.  No other specific complaints in a complete review of systems (except as listed in HPI above).      Objective:    BP 110/74   Pulse 94   Temp 99.1 F (37.3 C) (Oral)   Resp 18   Ht 5\' 3"  (1.6 m)   Wt 121 lb 14.4 oz (55.3 kg)   SpO2 99%   BMI 21.59 kg/m   Wt Readings from Last 3 Encounters:  08/19/22 121 lb 14.4 oz (55.3 kg)  07/15/22 121 lb 14.4 oz (55.3 kg)  06/17/22 113 lb 6.4 oz (51.4 kg)    Physical Exam  Constitutional: Patient appears well-developed and well-nourished.  No distress.  HEENT: head atraumatic, normocephalic, pupils equal and reactive to light, ears right tm cerumen impaction, left tm clear, neck supple, throat within normal limits Cardiovascular: Normal rate, regular rhythm and normal heart sounds.  No murmur heard. No BLE edema. Pulmonary/Chest: Effort normal and breath sounds normal. No respiratory distress. Abdominal: Soft.  There is no tenderness. Psychiatric: Patient has a normal mood and affect. behavior is normal. Judgment and thought content normal.  Results for orders placed or performed in visit on 06/10/22  Cytology - PAP  Result Value Ref Range   Adequacy      Satisfactory for evaluation; transformation zone component PRESENT.   Diagnosis      - Negative for intraepithelial lesion or malignancy (NILM)      Assessment & Plan:   Problem List Items Addressed This Visit   None Visit Diagnoses     Insomnia due to other mental disorder    -  Primary   start ambien at night, avoid stressers at bedtime, stay off phone an hour before going to bed, avoid caffine after lunch.    Relevant Medications   zolpidem (AMBIEN) 5 MG tablet   Impacted cerumen of right ear       ear irrigation performed   Relevant Orders   Ear  Lavage      Verbal consent given Possible side effects discussed with patient Ears were  lavaged with warm water and peroxide  Patient tolerated procedure well No complications    Follow up plan: Return if symptoms worsen or fail to improve.

## 2022-08-25 ENCOUNTER — Ambulatory Visit: Payer: Medicaid Other

## 2022-08-25 ENCOUNTER — Other Ambulatory Visit: Payer: Self-pay | Admitting: Nurse Practitioner

## 2022-08-25 ENCOUNTER — Other Ambulatory Visit: Payer: Self-pay | Admitting: Emergency Medicine

## 2022-08-25 MED ORDER — MEDROXYPROGESTERONE ACETATE 150 MG/ML IM SUSY: 150.0000 mg | PREFILLED_SYRINGE | INTRAMUSCULAR | 5 refills | Status: DC

## 2022-08-25 NOTE — Telephone Encounter (Signed)
Medication Refill - Medication: medroxyPROGESTERone (DEPO-PROVERA) injection 150 mg   / pt has an appt to have birth control injection done today at 1:30pm and needs refill sent asap  Has the patient contacted their pharmacy? No. (Agent: If no, request that the patient contact the pharmacy for the refill. If patient does not wish to contact the pharmacy document the reason why and proceed with request.) (Agent: If yes, when and what did the pharmacy advise?)  Preferred Pharmacy (with phone number or street name): [facility] #[id number] - [place], [place] Has the patient been seen for an appointment in the last year OR does the patient have an upcoming appointment? Yes.    Agent: Please be advised that RX refills may take up to 3 business days. We ask that you follow-up with your pharmacy.

## 2022-08-25 NOTE — Telephone Encounter (Signed)
Order sent to [facility] for refill 

## 2022-08-26 ENCOUNTER — Ambulatory Visit: Payer: Medicaid Other

## 2022-08-27 ENCOUNTER — Ambulatory Visit: Payer: Self-pay | Admitting: *Deleted

## 2022-08-27 ENCOUNTER — Ambulatory Visit (INDEPENDENT_AMBULATORY_CARE_PROVIDER_SITE_OTHER): Payer: Medicaid Other

## 2022-08-27 DIAGNOSIS — Z3009 Encounter for other general counseling and advice on contraception: Secondary | ICD-10-CM | POA: Diagnosis not present

## 2022-08-27 MED ORDER — MEDROXYPROGESTERONE ACETATE 150 MG/ML IM SUSP
150.0000 mg | Freq: Once | INTRAMUSCULAR | Status: AC
  Administered 2022-08-27: 150 mg via INTRAMUSCULAR

## 2022-08-27 NOTE — Telephone Encounter (Signed)
Requested medication (s) are due for refill today: yes  Requested medication (s) are on the active medication list: yes  Last refill:  08/25/22 #0.9 ml 5 refills  Future visit scheduled: yes in 1 month  Notes to clinic:  previous Rx sent to [place] and patient requesting [facility] before 1 pm to be given today . Please resend . Medication not assigned to a protocol.      Requested Prescriptions  Pending Prescriptions Disp Refills   medroxyPROGESTERone Acetate 150 MG/ML SUSY 0.9 mL 5    Sig: Inject 1 mL (150 mg total) into the muscle every 3 (three) months.     Off-Protocol Failed - 08/27/2022 11:18 AM      Failed - Medication not assigned to a protocol, review manually.      Passed - Valid encounter within last 12 months    Recent Outpatient Visits           1 week ago Insomnia due to other mental disorder   [place], FNP   1 month ago Generalized anxiety disorder   [place], FNP   2 months ago Generalized anxiety disorder   [place], FNP   3 months ago Generalized anxiety disorder   [facility] Bo Merino, FNP   4 months ago [facility] Myles Gip, DO       Future Appointments             In 1 month Reece Packer, [facility] Hidalgo, [facility], [facility]

## 2022-08-27 NOTE — Telephone Encounter (Signed)
Pt requested this medication to be sent to [facility].  However, it was sent to [facility]. Pt has appt at 1:30 today for her depo.  Can we re send asap?  [place], [place]

## 2022-08-27 NOTE — Telephone Encounter (Signed)
Opened chart to answer question for agent regarding Depo. Injection.   He let pt know it was sent to the [facility].   She is going there instead of the other pharmacy she thought it was sent to.

## 2022-09-01 MED ORDER — MEDROXYPROGESTERONE ACETATE 150 MG/ML IM SUSY: 150.0000 mg | PREFILLED_SYRINGE | INTRAMUSCULAR | 5 refills | Status: DC

## 2022-09-01 NOTE — Telephone Encounter (Signed)
Pharmacy change

## 2022-09-02 ENCOUNTER — Ambulatory Visit: Payer: Self-pay | Admitting: Psychiatry

## 2022-09-15 ENCOUNTER — Encounter: Payer: Self-pay | Admitting: Family Medicine

## 2022-09-17 ENCOUNTER — Ambulatory Visit (INDEPENDENT_AMBULATORY_CARE_PROVIDER_SITE_OTHER): Payer: Medicaid Other | Admitting: Nurse Practitioner

## 2022-09-17 ENCOUNTER — Other Ambulatory Visit: Payer: Self-pay

## 2022-09-17 VITALS — BP 110/72 | HR 92 | Temp 98.5°F | Resp 18 | Ht 63.0 in | Wt 126.7 lb

## 2022-09-17 DIAGNOSIS — F320 Major depressive disorder, single episode, mild: Secondary | ICD-10-CM | POA: Diagnosis not present

## 2022-09-17 DIAGNOSIS — F411 Generalized anxiety disorder: Secondary | ICD-10-CM | POA: Diagnosis not present

## 2022-09-17 DIAGNOSIS — Z23 Encounter for immunization: Secondary | ICD-10-CM | POA: Diagnosis not present

## 2022-09-17 MED ORDER — DULOXETINE HCL 30 MG PO CPEP: 30.0000 mg | ORAL_CAPSULE | Freq: Every day | ORAL | 0 refills | Status: DC

## 2022-09-17 NOTE — Assessment & Plan Note (Signed)
Wean off lexapro for the next week taking 1/2 tablet and then you can stop it.  Start taking cymbalta 30 mg daily.  Referral placed to psychiatry. 

## 2022-09-17 NOTE — Assessment & Plan Note (Signed)
Wean off lexapro for the next week taking 1/2 tablet and then you can stop it.  Start taking cymbalta 30 mg daily.  Referral placed to psychiatry.

## 2022-09-17 NOTE — Progress Notes (Signed)
BP 110/72   Pulse 92   Temp 98.5 F (36.9 C) (Oral)   Resp 18   Ht 5\' 3"  (1.6 m)   Wt 126 lb 11.2 oz (57.5 kg)   SpO2 98%   BMI 22.44 kg/m    Subjective:    Patient ID: , female    DOB: Jun 16, 2001, 21 y.o.   MRN: [id number]  HPI: Sherry Floyd is a 21 y.o. female  Chief Complaint  Patient presents with   Depression    Medication making her sick, would like to discuss a change   Anxiety   Depression/anxiety:  She is currently taking lexapro 20 mg daily and buspar 5 mg three times a day as needed. Patient states she feels like the medication is not working any more.  She also says she feels like the medication is making her sick.  She describes that the lexapro is causing a lot of nausea and she is not able to tolerate it. Discussed changing medication.  Will wean off lexapro and start Cymbalta 30 mg daily. Patient is going to take 1/2 tablet of lexapro for the next week and then stop it. She can start the Cymbalta today. Also discussed seeing psychiatry.  Will place referral.      09/17/2022    8:23 AM 08/19/2022    1:10 PM 07/15/2022    1:51 PM 06/17/2022    1:44 PM 05/20/2022    3:32 PM  Depression screen PHQ 2/9  Decreased Interest 0 0 0 1 1  Down, Depressed, Hopeless 1 1 1  0 1  PHQ - 2 Score 1 1 1 1 2   Altered sleeping 0 1 0 1 0  Tired, decreased energy 1 1 1  0 0  Change in appetite 0 1 0 0 1  Feeling bad or failure about yourself  0 1 0 0 1  Trouble concentrating 0 0 0 0 1  Moving slowly or fidgety/restless 0 0 0 0 1  Suicidal thoughts 0 0 0 0 0  PHQ-9 Score 2 5 2 2 6   Difficult doing work/chores Not difficult at all Somewhat difficult Not difficult at all Not difficult at all Somewhat difficult       09/17/2022    8:25 AM 08/19/2022    1:13 PM 07/15/2022    1:51 PM 06/17/2022    1:45 PM  GAD 7 : Generalized Anxiety Score  Nervous, Anxious, on Edge 1 1 1  0  Control/stop worrying 1 2 0 0  Worry too much - different things 1 3 0 0  Trouble  relaxing 1 2 0 0  Restless 1 0 0 0  Easily annoyed or irritable 1 2 1  0  Afraid - awful might happen 0 0 0 0  Total GAD 7 Score 6 10 2  0  Anxiety Difficulty Not difficult at all Somewhat difficult Not difficult at all Not difficult at all     Relevant past medical, surgical, family and social history reviewed and updated as indicated. Interim medical history since our last visit reviewed. Allergies and medications reviewed and updated.  Review of Systems  Constitutional: Negative for fever or weight change.  Respiratory: Negative for cough and shortness of breath.   Cardiovascular: Negative for chest pain or palpitations.  Gastrointestinal: Negative for abdominal pain, no bowel changes.  Musculoskeletal: Negative for gait problem or joint swelling.  Skin: Negative for rash.  Neurological: Negative for dizziness or headache.  No other specific complaints in a complete review of  systems (except as listed in HPI above).      Objective:    BP 110/72   Pulse 92   Temp 98.5 F (36.9 C) (Oral)   Resp 18   Ht 5\' 3"  (1.6 m)   Wt 126 lb 11.2 oz (57.5 kg)   SpO2 98%   BMI 22.44 kg/m   Wt Readings from Last 3 Encounters:  09/17/22 126 lb 11.2 oz (57.5 kg)  08/19/22 121 lb 14.4 oz (55.3 kg)  07/15/22 121 lb 14.4 oz (55.3 kg)    Physical Exam  Constitutional: Patient appears well-developed and well-nourished.  No distress.  HEENT: head atraumatic, normocephalic, pupils equal and reactive to light,  neck supple Cardiovascular: Normal rate, regular rhythm and normal heart sounds.  No murmur heard. No BLE edema. Pulmonary/Chest: Effort normal and breath sounds normal. No respiratory distress. Abdominal: Soft.  There is no tenderness. Psychiatric: Patient has a normal mood and affect. behavior is normal. Judgment and thought content normal.     Assessment & Plan:   Problem List Items Addressed This Visit       Other   Current mild episode of major depressive disorder without  prior episode ([place])   Relevant Medications   DULoxetine (CYMBALTA) 30 MG capsule   Other Relevant Orders   Ambulatory referral to Psychiatry   Generalized anxiety disorder - Primary   Relevant Medications   DULoxetine (CYMBALTA) 30 MG capsule   Other Relevant Orders   Ambulatory referral to Psychiatry   Other Visit Diagnoses     Need for influenza vaccination       Relevant Orders   Flu Vaccine QUAD 6+ mos PF IM (Fluarix Quad PF) (Completed)        Follow up plan: Return in about 4 weeks (around 10/15/2022) for follow up.

## 2022-10-07 ENCOUNTER — Other Ambulatory Visit: Payer: Self-pay

## 2022-10-07 ENCOUNTER — Ambulatory Visit: Payer: Medicaid Other | Admitting: Nurse Practitioner

## 2022-10-07 ENCOUNTER — Encounter: Payer: Self-pay | Admitting: Nurse Practitioner

## 2022-10-07 VITALS — BP 118/72 | HR 99 | Temp 98.2°F | Resp 18 | Ht 63.0 in | Wt 124.1 lb

## 2022-10-07 DIAGNOSIS — F411 Generalized anxiety disorder: Secondary | ICD-10-CM

## 2022-10-07 DIAGNOSIS — H6121 Impacted cerumen, right ear: Secondary | ICD-10-CM | POA: Diagnosis not present

## 2022-10-07 DIAGNOSIS — F320 Major depressive disorder, single episode, mild: Secondary | ICD-10-CM

## 2022-10-07 NOTE — Assessment & Plan Note (Signed)
Referral placed to psychiatry. 

## 2022-10-07 NOTE — Progress Notes (Signed)
BP 118/72   Pulse 99   Temp 98.2 F (36.8 C) (Oral)   Resp 18   Ht 5\' 3"  (1.6 m)   Wt 124 lb 1.6 oz (56.3 kg)   SpO2 98%   BMI 21.98 kg/m    Subjective:    Patient ID: , female    DOB: 12-13-2000, 21 y.o.   MRN: [id number]  HPI: Sherry Floyd is a 21 y.o. female  Chief Complaint  Patient presents with   Ear Pain    Right ear for 1 week   Right ear pain: patient reports that her ear feels full. She says it has felt like this for about a week. She says she did get an ear camera to look at it and can tell it is full of wax. Discussed ear lavage and patient would like to have that done.  Right ear cerumen impaction, after lavage TMs clear, there was still some wax, discussed using warm water and hydrogen peroxide a few drops every night to loosen wax.  Verbal consent given Possible side effects discussed with patient Ears were  lavaged with warm water and peroxide  Patient tolerated procedure well No complications    Anxiety and depression: patient has tried several different medications but has not tolerated them.  Will refer to [facility] for further psychiatric care. She has been on cymbalta, buspar, lexapro, and zoloft. PHQ9 is negative GAD scores are positive.      10/07/2022    3:04 PM 10/07/2022    2:58 PM 09/17/2022    8:23 AM 08/19/2022    1:10 PM 07/15/2022    1:51 PM  Depression screen PHQ 2/9  Decreased Interest 0 0 0 0 0  Down, Depressed, Hopeless 1 0 1 1 1   PHQ - 2 Score 1 0 1 1 1   Altered sleeping 0  0 1 0  Tired, decreased energy 1  1 1 1   Change in appetite 0  0 1 0  Feeling bad or failure about yourself  1  0 1 0  Trouble concentrating 0  0 0 0  Moving slowly or fidgety/restless 0  0 0 0  Suicidal thoughts 0  0 0 0  PHQ-9 Score 3  2 5 2   Difficult doing work/chores Not difficult at all  Not difficult at all Somewhat difficult Not difficult at all       09/17/2022    8:25 AM 08/19/2022    1:13 PM 07/15/2022    1:51 PM 06/17/2022     1:45 PM  GAD 7 : Generalized Anxiety Score  Nervous, Anxious, on Edge 1 1 1  0  Control/stop worrying 1 2 0 0  Worry too much - different things 1 3 0 0  Trouble relaxing 1 2 0 0  Restless 1 0 0 0  Easily annoyed or irritable 1 2 1  0  Afraid - awful might happen 0 0 0 0  Total GAD 7 Score 6 10 2  0  Anxiety Difficulty Not difficult at all Somewhat difficult Not difficult at all Not difficult at all     Relevant past medical, surgical, family and social history reviewed and updated as indicated. Interim medical history since our last visit reviewed. Allergies and medications reviewed and updated.  Review of Systems  Constitutional: Negative for fever or weight change.  Respiratory: Negative for cough and shortness of breath.   Cardiovascular: Negative for chest pain or palpitations.  Gastrointestinal: Negative for abdominal pain, no bowel changes.  Musculoskeletal: Negative for gait problem or joint swelling.  Skin: Negative for rash.  Neurological: Negative for dizziness or headache.  No other specific complaints in a complete review of systems (except as listed in HPI above).      Objective:    BP 118/72   Pulse 99   Temp 98.2 F (36.8 C) (Oral)   Resp 18   Ht 5\' 3"  (1.6 m)   Wt 124 lb 1.6 oz (56.3 kg)   SpO2 98%   BMI 21.98 kg/m   Wt Readings from Last 3 Encounters:  10/07/22 124 lb 1.6 oz (56.3 kg)  09/17/22 126 lb 11.2 oz (57.5 kg)  08/19/22 121 lb 14.4 oz (55.3 kg)    Physical Exam  Constitutional: Patient appears well-developed and well-nourished.  No distress.  HEENT: head atraumatic, normocephalic, pupils equal and reactive to light, ears TM left clear, right cerumen impaction, after irrigation right TM clear, neck supple, throat within normal limits Cardiovascular: Normal rate, regular rhythm and normal heart sounds.  No murmur heard. No BLE edema. Pulmonary/Chest: Effort normal and breath sounds normal. No respiratory distress. Abdominal: Soft.  There is no  tenderness. Psychiatric: Patient has a normal mood and affect. behavior is normal. Judgment and thought content normal.   Assessment & Plan:   Problem List Items Addressed This Visit       Other   Current mild episode of major depressive disorder without prior episode [facility])    Referral placed to psychiatry      Relevant Orders   Ambulatory referral to Psychiatry   Generalized anxiety disorder    Referral placed to psychiatry      Relevant Orders   Ambulatory referral to Psychiatry   Other Visit Diagnoses     Impacted cerumen of right ear    -  Primary   Patient reports she is feeling better after ear lavage. warm water and hydrogen peroxide a couple drops to soften wax   Relevant Orders   Ear Lavage        Follow up plan: Return if symptoms worsen or fail to improve.

## 2022-10-11 ENCOUNTER — Encounter: Payer: Self-pay | Admitting: Nurse Practitioner

## 2022-10-11 ENCOUNTER — Other Ambulatory Visit: Payer: Self-pay | Admitting: Nurse Practitioner

## 2022-10-11 DIAGNOSIS — H60311 Diffuse otitis externa, right ear: Secondary | ICD-10-CM

## 2022-10-11 MED ORDER — OFLOXACIN 0.3 % OT SOLN: 10.0000 [drp] | Freq: Every day | OTIC | 0 refills | Status: DC

## 2022-10-13 ENCOUNTER — Ambulatory Visit: Payer: Medicaid Other | Admitting: Nurse Practitioner

## 2022-10-15 ENCOUNTER — Emergency Department
Admission: EM | Admit: 2022-10-15 | Discharge: 2022-10-15 | Disposition: A | Discharge status: Home / Self Care | Payer: Medicaid Other | Attending: Student | Admitting: Student

## 2022-10-15 ENCOUNTER — Other Ambulatory Visit: Payer: Self-pay

## 2022-10-15 DIAGNOSIS — H6091 Unspecified otitis externa, right ear: Secondary | ICD-10-CM | POA: Insufficient documentation

## 2022-10-15 DIAGNOSIS — H60501 Unspecified acute noninfective otitis externa, right ear: Secondary | ICD-10-CM

## 2022-10-15 DIAGNOSIS — H9201 Otalgia, right ear: Secondary | ICD-10-CM | POA: Diagnosis present

## 2022-10-15 MED ORDER — CIPROFLOXACIN-DEXAMETHASONE 0.3-0.1 % OT SUSP: 4.0000 [drp] | Freq: Two times a day (BID) | OTIC | 0 refills | Status: DC

## 2022-10-15 NOTE — Discharge Instructions (Signed)
Use the drops as prescribed. Do not stick anything in your ears while this is healing, and when your ear has returned to normal. Return for new, worsening, or change in symptoms or other concerns.

## 2022-10-15 NOTE — ED Triage Notes (Signed)
Pt reports x2 weeks R ear pain; no drainage; swollen per pt; denies fever; pt in NAD.

## 2022-10-15 NOTE — ED Notes (Signed)
Provider JP in triage room.

## 2022-10-15 NOTE — ED Provider Notes (Signed)
[facility] Provider Note    None    (approximate)   History   Otalgia   HPI  Sherry Floyd is a 21 y.o. female  right ear pain for the past week.  Patient reports that she uses Q-tips and recently used an earwax remover kit to where she stuck something in her ear, and her pain began after this.  She has not noticed any bleeding.  No hearing changes.  No fevers or chills.  Patient Active Problem List   Diagnosis Date Noted   Generalized anxiety disorder 05/20/2022   Current mild episode of major depressive disorder without prior episode ([place]) 03/16/2022          Physical Exam   Triage Vital Signs: ED Triage Vitals  Enc Vitals Group     BP 10/15/22 1234 129/87     Pulse Rate 10/15/22 1234 74     Resp 10/15/22 1234 17     Temp 10/15/22 1234 98.4 F (36.9 C)     Temp Source 10/15/22 1234 Oral     SpO2 10/15/22 1234 98 %     Weight 10/15/22 1235 124 lb (56.2 kg)     Height 10/15/22 1235 _0  (1.575 m)     Head Circumference --      Peak Flow --      Pain Score 10/15/22 1235 10     Pain Loc --      Pain Edu? --      Excl. in [place]? --     Most recent vital signs: Vitals:   10/15/22 1234  BP: 129/87  Pulse: 74  Resp: 17  Temp: 98.4 F (36.9 C)  SpO2: 98%    Physical Exam Vitals and nursing note reviewed.  Constitutional:      General: Awake and alert. No acute distress.    Appearance: Normal appearance. The patient is normal weight.  HENT:     Head: Normocephalic and atraumatic.     Mouth: Mucous membranes are moist.  Eyes:     General: PERRL. Normal EOMs        Right eye: No discharge.        Left eye: No discharge.     Conjunctiva/sclera: Conjunctivae normal.  Right ear: Mild discomfort with manipulation of pinna and tragus.  Edematous and irritated appearing canal with otorrhea.  TM is intact, normal light reflex, no effusion or erythema.  No mastoid tenderness or erythema.  No proptosis of pinna.  No foreign body noted  within the canal Cardiovascular:     Rate and Rhythm: Normal rate and regular rhythm.     Pulses: Normal pulses.  Pulmonary:     Effort: Pulmonary effort is normal. No respiratory distress.  Abdominal:     Abdomen is soft. There is no abdominal tenderness. Musculoskeletal:        General: No swelling. Normal range of motion.     Cervical back: Normal range of motion and neck supple.  Skin:    General: Skin is warm and dry.     Capillary Refill: Capillary refill takes less than 2 seconds.     Findings: No rash.  Neurological:     Mental Status: The patient is awake and alert.      ED Results / Procedures / Treatments   Labs (all labs ordered are listed, but only abnormal results are displayed) Labs Reviewed - No data to display   EKG     RADIOLOGY  PROCEDURES:  Critical Care performed:   Procedures   MEDICATIONS ORDERED IN ED: Medications - No data to display   IMPRESSION / MDM / [place] / ED COURSE  I reviewed the triage vital signs and the nursing notes.   Differential diagnosis includes, but is not limited to, otitis externa, otitis media, mastoiditis, foreign body.  Patient is awake and alert, hemodynamically stable and afebrile.  She has minimal discomfort with manipulation of the pinna and tragus, and on exam, her canal is irritated, edematous, and there is otorrhea present.  Her TM is intact, no effusion or erythema to suggest otitis media.  She has no mastoid tenderness or erythema to suggest mastoiditis.  She has no history of diabetes to suggest malignant otitis externa.  She was started on drops.  She was instructed to not use any Q-tips or put anything in her ear as this likely is what precipitated her infection.  We discussed strict return precautions and the importance of close outpatient follow-up.  Patient understands and agrees with plan.  She was discharged in stable condition.   Patient's presentation is most consistent with acute  illness / injury with system symptoms.    FINAL CLINICAL IMPRESSION(S) / ED DIAGNOSES   Final diagnoses:  Acute otitis externa of right ear, unspecified type     Rx / DC Orders   ED Discharge Orders          Ordered    ciprofloxacin-dexamethasone (CIPRODEX) OTIC suspension  2 times daily        10/15/22 1243             Note:  This document was prepared using Dragon voice recognition software and may include unintentional dictation errors.   Emeline Gins 10/15/22 1502    Vanessa [place], MD 10/15/22 [phone]

## 2022-10-18 ENCOUNTER — Ambulatory Visit: Payer: Medicaid Other | Admitting: Nurse Practitioner

## 2022-11-02 ENCOUNTER — Telehealth: Payer: Self-pay | Admitting: Nurse Practitioner

## 2022-11-02 NOTE — Telephone Encounter (Signed)
Referral Request - Has patient seen PCP for this complaint? Yes.   *If NO, is insurance requiring patient see PCP for this issue before PCP can refer them? Referral for which specialty: psychiatrist Preferred provider/office: any Reason for referral: pt states at her last OV, Sherry Floyd was going to refer her to a psychiatrist, but she has not heard anything from anyone. Would like to know if referral was done.

## 2022-11-02 NOTE — Telephone Encounter (Signed)
Patient notified to check with the mental health benefit number on back of her insurance card to see who they recommend. Patient notified there has been 4 referrals on her chart and she has canceled all appointments or no show.

## 2022-11-03 ENCOUNTER — Other Ambulatory Visit: Payer: Self-pay | Admitting: Nurse Practitioner

## 2022-11-03 DIAGNOSIS — F5105 Insomnia due to other mental disorder: Secondary | ICD-10-CM

## 2022-11-03 NOTE — Telephone Encounter (Signed)
Requested medication (s) are due for refill today - yes  Requested medication (s) are on the active medication list -yes  Future visit scheduled -no  Last refill: 08/19/22 #30 1RF  Notes to clinic: non delegated Rx  Requested Prescriptions  Pending Prescriptions Disp Refills   zolpidem (AMBIEN) 5 MG tablet [Pharmacy Med Name: ZOLPIDEM 5MG  TABLETS] 30 tablet     Sig: TAKE 1 TABLET(5 MG) BY MOUTH AT BEDTIME AS NEEDED FOR SLEEP     Not Delegated - Psychiatry:  Anxiolytics/Hypnotics Failed - 11/03/2022 10:46 AM      Failed - This refill cannot be delegated      Failed - Urine Drug Screen completed in last 360 days      Passed - Valid encounter within last 6 months    Recent Outpatient Visits           3 weeks ago Impacted cerumen of right ear   [facility], FNP   1 month ago Generalized anxiety disorder   [facility], FNP   2 months ago Insomnia due to other mental disorder   [facility], FNP   3 months ago Generalized anxiety disorder   [facility], FNP   4 months ago Generalized anxiety disorder   [facility], FNP                 Requested Prescriptions  Pending Prescriptions Disp Refills   zolpidem (AMBIEN) 5 MG tablet [Pharmacy Med Name: ZOLPIDEM 5MG  TABLETS] 30 tablet     Sig: TAKE 1 TABLET(5 MG) BY MOUTH AT BEDTIME AS NEEDED FOR SLEEP     Not Delegated - Psychiatry:  Anxiolytics/Hypnotics Failed - 11/03/2022 10:46 AM      Failed - This refill cannot be delegated      Failed - Urine Drug Screen completed in last 360 days      Passed - Valid encounter within last 6 months    Recent Outpatient Visits           3 weeks ago Impacted cerumen of right ear   [facility], FNP   1 month ago Generalized anxiety disorder   [facility], FNP   2 months ago Insomnia due to other mental disorder   [facility], FNP   3 months ago Generalized anxiety disorder   [facility], FNP   4 months ago Generalized anxiety disorder   [facility] [place], [facility], FNP

## 2022-11-05 NOTE — Telephone Encounter (Signed)
Pt is calling stating that she only canceled one appt because it was so far out. Pt states that she will follow up with her insurance to see who they recommend.

## 2022-11-08 ENCOUNTER — Encounter: Payer: Self-pay | Admitting: Nurse Practitioner

## 2022-11-08 ENCOUNTER — Ambulatory Visit (INDEPENDENT_AMBULATORY_CARE_PROVIDER_SITE_OTHER): Payer: Medicaid Other | Admitting: Nurse Practitioner

## 2022-11-08 VITALS — BP 116/72 | HR 120 | Temp 98.7°F | Resp 16 | Ht 63.0 in | Wt 126.7 lb

## 2022-11-08 DIAGNOSIS — J029 Acute pharyngitis, unspecified: Secondary | ICD-10-CM | POA: Diagnosis not present

## 2022-11-08 DIAGNOSIS — R051 Acute cough: Secondary | ICD-10-CM | POA: Diagnosis not present

## 2022-11-08 LAB — POCT RAPID STREP A (OFFICE): Rapid Strep A Screen: NEGATIVE

## 2022-11-08 MED ORDER — AMOXICILLIN 400 MG/5ML PO SUSR: 500.0000 mg | Freq: Two times a day (BID) | ORAL | 0 refills | Status: DC

## 2022-11-08 MED ORDER — BENZONATATE 100 MG PO CAPS: 200.0000 mg | ORAL_CAPSULE | Freq: Two times a day (BID) | ORAL | 0 refills | Status: DC | PRN

## 2022-11-08 NOTE — Progress Notes (Signed)
BP 116/72   Pulse (!) 120   Temp 98.7 F (37.1 C)   Resp 16   Ht 5\' 3"  (1.6 m)   Wt 126 lb 11.2 oz (57.5 kg)   SpO2 99%   BMI 22.44 kg/m    Subjective:    Patient ID: , female    DOB: 2001/10/11, 21 y.o.   MRN: [id number]  HPI: Sherry Floyd is a 21 y.o. female  Chief Complaint  Patient presents with   URI    Cough and sore throat since   URI: patient reports symptoms started this morning.  Symptoms include sore throat, headache and cough.  She denies any fever, nasal congestion, or sore throat.  She says she has not taken anything for the symptoms.  She says she was recently around family who are also sick. Will get rapid strep and covid PCR.  Recommend taking zyrtec or Claritin, Flonase and mucinex. Will send in prescription for tessalon perls.can gargle with salt water, use throat lozenges and drink tea with lemon and honey.   Rapid strep negative, however, will treat due to clinical findings.  covid pending.  Relevant past medical, surgical, family and social history reviewed and updated as indicated. Interim medical history since our last visit reviewed. Allergies and medications reviewed and updated.  Review of Systems  Constitutional: Negative for fever or weight change.  HEENT: positive for sore throat Respiratory: positive for cough and negative shortness of breath.   Cardiovascular: Negative for chest pain or palpitations.  Gastrointestinal: Negative for abdominal pain, no bowel changes.  Musculoskeletal: Negative for gait problem or joint swelling.  Skin: Negative for rash.  Neurological: Negative for dizziness, positive for  headache.  No other specific complaints in a complete review of systems (except as listed in HPI above).      Objective:    BP 116/72   Pulse (!) 120   Temp 98.7 F (37.1 C)   Resp 16   Ht 5\' 3"  (1.6 m)   Wt 126 lb 11.2 oz (57.5 kg)   SpO2 99%   BMI 22.44 kg/m   Wt Readings from Last 3 Encounters:  11/08/22  126 lb 11.2 oz (57.5 kg)  10/07/22 124 lb 1.6 oz (56.3 kg)  09/17/22 126 lb 11.2 oz (57.5 kg)    Physical Exam  Constitutional: Patient appears well-developed and well-nourished.  No distress.  HEENT: head atraumatic, normocephalic, pupils equal and reactive to light, ears TMs clear, neck supple, throat erythematous with exudate, lymphadenopathy present. Cardiovascular: Normal rate, regular rhythm and normal heart sounds.  No murmur heard. No BLE edema. Pulmonary/Chest: Effort normal and breath sounds normal. No respiratory distress. Abdominal: Soft.  There is no tenderness. Psychiatric: Patient has a normal mood and affect. behavior is normal. Judgment and thought content normal.     Assessment & Plan:   Problem List Items Addressed This Visit   None Visit Diagnoses     Sore throat    -  Primary   rapid strep negative, however will treat due to clinical findings.  push fluds, start amoxicillin, can take tessalon perls for cough.  xyrtec, flonase mucinex   Relevant Medications   amoxicillin (AMOXIL) 400 MG/5ML suspension   Other Relevant Orders   Novel Coronavirus, NAA ([facility])   Acute cough       rapid strep negative, however will treat due to clinical findings. push fluds, start amoxicillin, can take tessalon perls for cough. xyrtec, flonase mucinex   Relevant Medications  benzonatate (TESSALON) 100 MG capsule   Other Relevant Orders   Novel Coronavirus, NAA ([facility])        Follow up plan: Return if symptoms worsen or fail to improve.

## 2022-11-08 NOTE — Addendum Note (Signed)
Addended by: Davene Costain on: 11/08/2022 04:39 PM   Modules accepted: Orders

## 2022-11-10 LAB — NOVEL CORONAVIRUS, NAA: SARS-CoV-2, NAA: NOT DETECTED

## 2022-11-11 ENCOUNTER — Ambulatory Visit: Payer: Self-pay | Admitting: *Deleted

## 2022-11-11 ENCOUNTER — Other Ambulatory Visit: Payer: Self-pay | Admitting: Nurse Practitioner

## 2022-11-11 DIAGNOSIS — F5105 Insomnia due to other mental disorder: Secondary | ICD-10-CM
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
## 2022-11-11 NOTE — Telephone Encounter (Signed)
Medication Refill - Medication: zolpidem (AMBIEN) 5 MG tablet   Has the patient contacted their pharmacy? Yes.    Pharmacy advised patient to contact PCP     Preferred Pharmacy   [id number] - [place], [place] - [address] AT [facility] [address] Phone: [phone]  Fax: [phone]     Has the patient been seen for an appointment in the last year OR does the patient have an upcoming appointment? Yes.    Agent: Please be advised that RX refills may take up to 3 business days. We ask that you follow-up with your pharmacy.

## 2022-11-12 ENCOUNTER — Ambulatory Visit: Payer: Medicaid Other

## 2022-11-12 NOTE — Telephone Encounter (Signed)
FYI

## 2022-11-12 NOTE — Telephone Encounter (Signed)
Requested medication (s) are due for refill today: yes  Requested medication (s) are on the active medication list: yes  Last refill:  08/19/22 #30 with 1 refill  Future visit scheduled: yes  Notes to clinic:  Please review for refill. Refill not delegated per protocol.     Requested Prescriptions  Pending Prescriptions Disp Refills   zolpidem (AMBIEN) 5 MG tablet 30 tablet 1    Sig: Take 1 tablet (5 mg total) by mouth at bedtime as needed for sleep.     Not Delegated - Psychiatry:  Anxiolytics/Hypnotics Failed - 11/11/2022  2:42 PM      Failed - This refill cannot be delegated      Failed - Urine Drug Screen completed in last 360 days      Passed - Valid encounter within last 6 months    Recent Outpatient Visits           4 days ago Sore throat   [facility] Berniece Salines, FNP   1 month ago Impacted cerumen of right ear   [facility] Berniece Salines, FNP   1 month ago Generalized anxiety disorder   [facility] Bernie[facility] Sal[facility], FNP   2 months ago Insomnia due to other mental disorder   [facility] Bernie[facility] Salines, FNP   4 months ago Generalized anxiety disorder   [facility] Berniece Salines, [place]

## 2022-11-16 ENCOUNTER — Ambulatory Visit (INDEPENDENT_AMBULATORY_CARE_PROVIDER_SITE_OTHER): Payer: Medicaid Other | Admitting: Nurse Practitioner

## 2022-11-16 ENCOUNTER — Ambulatory Visit: Payer: Medicaid Other

## 2022-11-16 ENCOUNTER — Other Ambulatory Visit (HOSPITAL_COMMUNITY)
Admission: RE | Admit: 2022-11-16 | Discharge: 2022-11-16 | Disposition: A | Discharge status: Home / Self Care | Payer: Medicaid Other | Source: Ambulatory Visit | Attending: Nurse Practitioner | Admitting: Nurse Practitioner

## 2022-11-16 ENCOUNTER — Encounter: Payer: Self-pay | Admitting: Nurse Practitioner

## 2022-11-16 ENCOUNTER — Other Ambulatory Visit: Payer: Self-pay

## 2022-11-16 VITALS — BP 118/72 | HR 100 | Temp 98.8°F | Resp 16 | Ht 63.0 in | Wt 114.5 lb

## 2022-11-16 DIAGNOSIS — B379 Candidiasis, unspecified: Secondary | ICD-10-CM | POA: Diagnosis not present

## 2022-11-16 DIAGNOSIS — Z309 Encounter for contraceptive management, unspecified: Secondary | ICD-10-CM | POA: Diagnosis not present

## 2022-11-16 DIAGNOSIS — N898 Other specified noninflammatory disorders of vagina: Secondary | ICD-10-CM

## 2022-11-16 DIAGNOSIS — T3695XA Adverse effect of unspecified systemic antibiotic, initial encounter: Secondary | ICD-10-CM | POA: Diagnosis not present

## 2022-11-16 MED ORDER — MEDROXYPROGESTERONE ACETATE 150 MG/ML IM SUSP
150.0000 mg | Freq: Once | INTRAMUSCULAR | Status: AC
  Administered 2022-11-16: 150 mg via INTRAMUSCULAR

## 2022-11-16 MED ORDER — FLUCONAZOLE 150 MG PO TABS: 150.0000 mg | ORAL_TABLET | ORAL | 0 refills | Status: DC | PRN

## 2022-11-16 NOTE — Progress Notes (Signed)
   BP 118/72   Pulse 100   Temp 98.8 F (37.1 C) (Oral)   Resp 16   Ht 5\' 3"  (1.6 m)   Wt 114 lb 8 oz (51.9 kg)   SpO2 98%   BMI 20.28 kg/m    Subjective:    Patient ID: , female    DOB: May 13, 2001, 21 y.o.   MRN: [id number]  HPI: Sherry Floyd is a 21 y.o. female  Chief Complaint  Patient presents with   Vaginitis    Itching for 3 days   Contraception    Medication refills   Vaginal itching: patient reports she has had vaginal itching and discharge for three days. She was recently on antibiotics for strep throat.  Will get vaginal swab and start treatment for yeast.    Patient reports she also needs her depo shot given.   Relevant past medical, surgical, family and social history reviewed and updated as indicated. Interim medical history since our last visit reviewed. Allergies and medications reviewed and updated.  Review of Systems  Constitutional: Negative for fever or weight change.  Respiratory: Negative for cough and shortness of breath.   Cardiovascular: Negative for chest pain or palpitations.  Gastrointestinal: Negative for abdominal pain, no bowel changes.  GU: positive for vaginal itching and discharge Musculoskeletal: Negative for gait problem or joint swelling.  Skin: Negative for rash.  Neurological: Negative for dizziness or headache.  No other specific complaints in a complete review of systems (except as listed in HPI above).      Objective:    BP 118/72   Pulse 100   Temp 98.8 F (37.1 C) (Oral)   Resp 16   Ht 5\' 3"  (1.6 m)   Wt 114 lb 8 oz (51.9 kg)   SpO2 98%   BMI 20.28 kg/m   Wt Readings from Last 3 Encounters:  11/16/22 114 lb 8 oz (51.9 kg)  11/08/22 126 lb 11.2 oz (57.5 kg)  10/07/22 124 lb 1.6 oz (56.3 kg)    Physical Exam  Constitutional: Patient appears well-developed and well-nourished.  No distress.  HEENT: head atraumatic, normocephalic, pupils equal and reactive to light, neck supple Cardiovascular:  Normal rate, regular rhythm and normal heart sounds.  No murmur heard. No BLE edema. Pulmonary/Chest: Effort normal and breath sounds normal. No respiratory distress. Abdominal: Soft.  There is no tenderness. Psychiatric: Patient has a normal mood and affect. behavior is normal. Judgment and thought content normal.   Assessment & Plan:   Problem List Items Addressed This Visit   None Visit Diagnoses     Vaginal itching    -  Primary   vaginal swab collected, will treat for yeast due to recent antibiotic use   Relevant Medications   fluconazole (DIFLUCAN) 150 MG tablet   Other Relevant Orders   Cervicovaginal ancillary only   Antibiotic-induced yeast infection       diflucan sent in   Relevant Medications   fluconazole (DIFLUCAN) 150 MG tablet   Encounter for contraceptive management, unspecified type       depo shot administered        Follow up plan: Return if symptoms worsen or fail to improve.

## 2022-11-16 NOTE — Addendum Note (Signed)
Addended by: Viveca Beckstrom G on: 11/16/2022 04:14 PM   Modules accepted: Orders

## 2022-11-18 LAB — CERVICOVAGINAL ANCILLARY ONLY
Bacterial Vaginitis (gardnerella): NEGATIVE
Candida Glabrata: NEGATIVE
Candida Vaginitis: POSITIVE — AB
Chlamydia: NEGATIVE
Comment: NEGATIVE
Comment: NEGATIVE
Comment: NEGATIVE
Comment: NEGATIVE
Comment: NEGATIVE
Comment: NORMAL
Neisseria Gonorrhea: NEGATIVE
Trichomonas: NEGATIVE

## 2022-12-30 ENCOUNTER — Other Ambulatory Visit: Payer: Self-pay | Admitting: Nurse Practitioner

## 2022-12-30 DIAGNOSIS — F411 Generalized anxiety disorder: Secondary | ICD-10-CM

## 2022-12-30 DIAGNOSIS — F320 Major depressive disorder, single episode, mild: Secondary | ICD-10-CM

## 2022-12-30 NOTE — Telephone Encounter (Signed)
Requested Prescriptions  Pending Prescriptions Disp Refills   busPIRone (BUSPAR) 7.5 MG tablet [Pharmacy Med Name: BUSPIRONE HCL 7.5 MG TAB] 90 tablet 0    Sig: TAKE 1 TABLET BY MOUTH 3 TIMES DAILY AS NEEDED     Psychiatry: Anxiolytics/Hypnotics - Non-controlled Passed - 12/30/2022  1:23 PM      Passed - Valid encounter within last 12 months    Recent Outpatient Visits           1 month ago Vaginal itching   [facility] Bo Merino, FNP   1 month ago Sore throat   [facility] Bo Merino, FNP   2 months ago Impacted cerumen of right ear   [facility] Bo Merino, FNP   3 months ago Generalized anxiety disorder   [place], FNP   4 months ago Insomnia due to other mental disorder   [facility] Bo Merino, [place]

## 2023-02-02 ENCOUNTER — Ambulatory Visit (INDEPENDENT_AMBULATORY_CARE_PROVIDER_SITE_OTHER): Payer: Medicaid Other

## 2023-02-02 DIAGNOSIS — Z309 Encounter for contraceptive management, unspecified: Secondary | ICD-10-CM

## 2023-02-02 MED ORDER — MEDROXYPROGESTERONE ACETATE 150 MG/ML IM SUSY: PREFILLED_SYRINGE | INTRAMUSCULAR | Status: DC

## 2023-04-28 ENCOUNTER — Ambulatory Visit (INDEPENDENT_AMBULATORY_CARE_PROVIDER_SITE_OTHER): Payer: Medicaid Other | Admitting: Emergency Medicine

## 2023-04-28 DIAGNOSIS — Z309 Encounter for contraceptive management, unspecified: Secondary | ICD-10-CM

## 2023-05-15 ENCOUNTER — Telehealth: Payer: Medicaid Other | Admitting: Physician Assistant

## 2023-05-15 DIAGNOSIS — R3989 Other symptoms and signs involving the genitourinary system: Secondary | ICD-10-CM

## 2023-05-16 MED ORDER — NITROFURANTOIN MONOHYD MACRO 100 MG PO CAPS: 100.0000 mg | ORAL_CAPSULE | Freq: Two times a day (BID) | ORAL | 0 refills | Status: DC

## 2023-05-16 NOTE — Progress Notes (Signed)
E-Visit for Urinary Problems  We are sorry that you are not feeling well.  Here is how we plan to help!  Based on what you shared with me it looks like you most likely have a simple urinary tract infection.  A UTI (Urinary Tract Infection) is a bacterial infection of the bladder.  Most cases of urinary tract infections are simple to treat but a key part of your care is to encourage you to drink plenty of fluids and watch your symptoms carefully.  I have prescribed MacroBid 100 mg twice a day for 5 days.  Your symptoms should gradually improve. Call us if the burning in your urine worsens, you develop worsening fever, back pain or pelvic pain or if your symptoms do not resolve after completing the antibiotic.  Urinary tract infections can be prevented by drinking plenty of water to keep your body hydrated.  Also be sure when you wipe, wipe from front to back and don't hold it in!  If possible, empty your bladder every 4 hours.  HOME CARE Drink plenty of fluids Compete the full course of the antibiotics even if the symptoms resolve Remember, when you need to go.go. Holding in your urine can increase the likelihood of getting a UTI! GET HELP RIGHT AWAY IF: You cannot urinate You get a high fever Worsening back pain occurs You see blood in your urine You feel sick to your stomach or throw up You feel like you are going to pass out  MAKE SURE YOU  Understand these instructions. Will watch your condition. Will get help right away if you are not doing well or get worse.   Thank you for choosing an e-visit.  Your e-visit answers were reviewed by a board certified advanced clinical practitioner to complete your personal care plan. Depending upon the condition, your plan could have included both over the counter or prescription medications.  Please review your pharmacy choice. Make sure the pharmacy is open so you can pick up prescription now. If there is a problem, you may contact your  provider through MyChart messaging and have the prescription routed to another pharmacy.  Your safety is important to us. If you have drug allergies check your prescription carefully.   For the next 24 hours you can use MyChart to ask questions about today's visit, request a non-urgent call back, or ask for a work or school excuse. You will get an email in the next two days asking about your experience. I hope that your e-visit has been valuable and will speed your recovery.  I have spent 5 minutes in review of e-visit questionnaire, review and updating patient chart, medical decision making and response to patient.   Sha Amer M Emilya Justen, PA-C  

## 2023-06-02 ENCOUNTER — Ambulatory Visit: Payer: Medicaid Other | Admitting: Obstetrics

## 2023-06-13 ENCOUNTER — Encounter: Payer: Self-pay | Admitting: Obstetrics

## 2023-06-13 ENCOUNTER — Ambulatory Visit (INDEPENDENT_AMBULATORY_CARE_PROVIDER_SITE_OTHER): Payer: Medicaid Other | Admitting: Obstetrics

## 2023-06-13 VITALS — BP 115/79 | HR 112 | Ht 62.0 in | Wt 129.0 lb

## 2023-06-13 DIAGNOSIS — Z01419 Encounter for gynecological examination (general) (routine) without abnormal findings: Secondary | ICD-10-CM

## 2023-06-13 DIAGNOSIS — Z0000 Encounter for general adult medical examination without abnormal findings: Secondary | ICD-10-CM | POA: Diagnosis not present

## 2023-06-13 DIAGNOSIS — M25551 Pain in right hip: Secondary | ICD-10-CM

## 2023-06-13 DIAGNOSIS — F419 Anxiety disorder, unspecified: Secondary | ICD-10-CM

## 2023-06-13 NOTE — Progress Notes (Signed)
ANNUAL GYNECOLOGICAL EXAM  SUBJECTIVE  HPI  Sherry Floyd is a 22 y.o.-year-old [id number] who presents for an annual gynecological exam today.  She denies pelvic pain, dyspareunia, abnormal vaginal bleeding or discharge, and UTI symptoms. She is sexually active with one female partner. She reports that she has been having some depression and anxiety and would like to talk to a therapist. She denies SI.  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  Social History Lives with boyfriend and his family. Feels safe there Work: Consulting civil engineer Exercise: walking, some cardio Substances: Occasional EtOH and MJ; denies tobacco, vape, and other recreational drugs  Obstetric History OB History     Gravida  0   Para  0   Term  0   Preterm  0   AB  0   Living  0      SAB  0   IAB  0   Ectopic  0   Multiple  0   Live Births  0            GYN/Menstrual History No LMP recorded. Patient has had an injection. Does not get a period on Depo Last Pap: 05/2022. NILM. Contraception: Depo  Prevention Dentist: q6 months Eye exam: looking for a dr who will accept Medicaid Mammogram: at 40 Colonoscopy: at 4  Current Medications Outpatient Medications Prior to Visit  Medication Sig   cetirizine (ZYRTEC) 10 MG tablet Take 1 tablet (10 mg total) by mouth daily.   medroxyPROGESTERone Acetate 150 MG/ML SUSY Inject 1 mL (150 mg total) into the muscle every 3 (three) months.   nitrofurantoin, macrocrystal-monohydrate, (MACROBID) 100 MG capsule Take 1 capsule (100 mg total) by mouth 2 (two) times daily.   zolpidem (AMBIEN) 5 MG tablet TAKE 1 TABLET(5 MG) BY MOUTH AT BEDTIME AS NEEDED FOR SLEEP   [DISCONTINUED] fluconazole (DIFLUCAN) 150 MG tablet Take 1 tablet (150 mg total) by mouth every 3 (three) days as needed  (for vaginal itching/yeast infection sx).   Facility-Administered Medications Prior to Visit  Medication Dose Route Frequency Provider   medroxyPROGESTERone Acetate SUSY   Intramuscular Q90 days Berniece Salines, FNP      The pregnancy intention screening data noted above was reviewed. Potential methods of contraception were discussed. The patient elected to proceed with Depo.   ROS Constitutional: Denied constitutional symptoms, night sweats, recent illness, fatigue, fever, insomnia and weight loss.  Eyes: Denied eye symptoms, eye pain, photophobia, vision change and visual disturbance.  Ears/Nose/Throat/Neck: Denied ear, nose, throat or neck symptoms, hearing loss, nasal discharge, sinus congestion and sore throat.  Cardiovascular: Denied cardiovascular symptoms, arrhythmia, chest pain/pressure, edema, exercise intolerance, orthopnea and palpitations.  Respiratory: Denied pulmonary symptoms, asthma, pleuritic pain, productive sputum, cough, dyspnea and wheezing.  Gastrointestinal: Denied, gastro-esophageal reflux, melena, nausea and vomiting.  Genitourinary: Denied genitourinary symptoms including symptomatic vaginal discharge, pelvic relaxation issues, and urinary complaints.  Musculoskeletal: Denied musculoskeletal symptoms, stiffness, swelling, muscle weakness and myalgia. +hip pain  Dermatologic: Denied dermatology symptoms, rash and scar. +dry patch on hip/thigh  Neurologic: Denied neurology symptoms, dizziness, headache, neck pain and syncope.  Psychiatric: Denied psychiatric symptoms, anxiety and depression.  Endocrine: Denied endocrine symptoms including hot flashes and night sweats.    OBJECTIVE  Ht 5\' 2"  (1.575 m)   Wt 129  lb (58.5 kg)   BMI 23.59 kg/m    Physical examination General NAD, Conversant  HEENT Atraumatic; Op clear with mmm.  Normo-cephalic. Pupils reactive. Anicteric sclerae  Thyroid/Neck Smooth without nodularity or enlargement. Normal ROM.  Neck Supple.   Skin No rashes, lesions or ulceration. Normal palpated skin turgor. No nodularity.  Breasts: No masses or discharge.  Symmetric.  No axillary adenopathy.  Lungs: Clear to auscultation.No rales or wheezes. Normal Respiratory effort, no retractions.  Heart: NSR.  No murmurs or rubs appreciated. No peripheral edema  Abdomen: Soft.  Non-tender.  No masses.  No HSM. No hernia  Extremities: Moves all appropriately.  Normal ROM for age. No lymphadenopathy.  Neuro: Oriented to PPT.  Normal mood. Normal affect.     Pelvic: Deferred    ASSESSMENT  1) Annual exam 2) Anxiety  PLAN 1) Physical exam as noted. Discussed healthy lifestyle choices and preventive care. Pap due 2026. Declines STI testing. Routine labs today. 2) Referral to behavioral health placed. List of local therapists sent via MyChart  Return in one year for annual exam or as needed for concerns.   Guadlupe Spanish, CNM

## 2023-06-14 LAB — COMPREHENSIVE METABOLIC PANEL
ALT: 16 IU/L (ref 0–32)
AST: 15 IU/L (ref 0–40)
Albumin: 4.5 g/dL (ref 4.0–5.0)
Alkaline Phosphatase: 90 IU/L (ref 44–121)
BUN/Creatinine Ratio: 13 (ref 9–23)
BUN: 9 mg/dL (ref 6–20)
Bilirubin Total: 0.5 mg/dL (ref 0.0–1.2)
CO2: 21 mmol/L (ref 20–29)
Calcium: 9.4 mg/dL (ref 8.7–10.2)
Chloride: 105 mmol/L (ref 96–106)
Creatinine, Ser: 0.68 mg/dL (ref 0.57–1.00)
Globulin, Total: 1.8 g/dL (ref 1.5–4.5)
Glucose: 88 mg/dL (ref 70–99)
Potassium: 4.8 mmol/L (ref 3.5–5.2)
Sodium: 139 mmol/L (ref 134–144)
Total Protein: 6.3 g/dL (ref 6.0–8.5)
eGFR: 126 mL/min/{1.73_m2} (ref >59)

## 2023-06-14 LAB — CBC
Hematocrit: 46.5 % (ref 34.0–46.6)
Hemoglobin: 15.2 g/dL (ref 11.1–15.9)
MCH: 28.6 pg (ref 26.6–33.0)
MCHC: 32.7 g/dL (ref 31.5–35.7)
MCV: 87 fL (ref 79–97)
Platelets: 215 10*3/uL (ref 150–450)
RBC: 5.32 x10E6/uL — ABNORMAL HIGH (ref 3.77–5.28)
RDW: 12.9 % (ref 11.7–15.4)
WBC: 4.9 10*3/uL (ref 3.4–10.8)

## 2023-06-14 LAB — LIPID PANEL
Chol/HDL Ratio: 2.2 ratio (ref 0.0–4.4)
Cholesterol, Total: 133 mg/dL (ref 100–199)
HDL: 61 mg/dL (ref >39)
LDL Chol Calc (NIH): 61 mg/dL (ref 0–99)
Triglycerides: 50 mg/dL (ref 0–149)
VLDL Cholesterol Cal: 11 mg/dL (ref 5–40)

## 2023-06-14 LAB — HEMOGLOBIN A1C
Est. average glucose Bld gHb Est-mCnc: 105 mg/dL
Hgb A1c MFr Bld: 5.3 % (ref 4.8–5.6)

## 2023-06-14 LAB — TSH: TSH: 0.817 u[IU]/mL (ref 0.450–4.500)

## 2023-06-16 ENCOUNTER — Encounter: Payer: Self-pay | Admitting: Obstetrics

## 2023-06-17 ENCOUNTER — Encounter: Payer: Self-pay | Admitting: Nurse Practitioner

## 2023-06-20 ENCOUNTER — Ambulatory Visit: Payer: Medicaid Other | Admitting: Nurse Practitioner

## 2023-06-20 ENCOUNTER — Other Ambulatory Visit: Payer: Self-pay | Admitting: Nurse Practitioner

## 2023-06-20 DIAGNOSIS — F419 Anxiety disorder, unspecified: Secondary | ICD-10-CM

## 2023-06-20 MED ORDER — BUSPIRONE HCL 7.5 MG PO TABS
7.5000 mg | ORAL_TABLET | Freq: Three times a day (TID) | ORAL | 1 refills | Status: AC | PRN
Start: 2023-06-20 — End: ?

## 2023-06-20 NOTE — Progress Notes (Deleted)
There were no vitals taken for this visit.   Subjective:    Patient ID: Sherry Floyd, female    DOB: [birth year], 22 y.o.   MRN: [id number]  HPI: Sherry Floyd is a 22 y.o. female  No chief complaint on file.  Depression/anxiety Medication not currently on medication, previously tired zoloft, buspar, lexapro, and cymbalta.  Patient had reported she did not tolerate them.   Compliant *** Side effects *** PHQ9 *** GAD *** Therapy referral was placed to [facility] multiple times, patient wound schedule appointment and then cancel.       11/16/2022    3:48 PM 11/08/2022    4:11 PM 10/07/2022    3:04 PM 10/07/2022    2:58 PM 09/17/2022    8:23 AM  Depression screen PHQ 2/9  Decreased Interest 0 0 0 0 0  Down, Depressed, Hopeless 0 0 1 0 1  PHQ - 2 Score 0 0 1 0 1  Altered sleeping  0 0  0  Tired, decreased energy  0 1  1  Change in appetite  0 0  0  Feeling bad or failure about yourself   0 1  0  Trouble concentrating  0 0  0  Moving slowly or fidgety/restless  0 0  0  Suicidal thoughts  0 0  0  PHQ-9 Score  0 3  2  Difficult doing work/chores  Not difficult at all Not difficult at all  Not difficult at all       09/17/2022    8:25 AM 08/19/2022    1:13 PM 07/15/2022    1:51 PM 06/17/2022    1:45 PM  GAD 7 : Generalized Anxiety Score  Nervous, Anxious, on Edge 1 1 1  0  Control/stop worrying 1 2 0 0  Worry too much - different things 1 3 0 0  Trouble relaxing 1 2 0 0  Restless 1 0 0 0  Easily annoyed or irritable 1 2 1  0  Afraid - awful might happen 0 0 0 0  Total GAD 7 Score 6 10 2  0  Anxiety Difficulty Not difficult at all Somewhat difficult Not difficult at all Not difficult at all     Relevant past medical, surgical, family and social history reviewed and updated as indicated. Interim medical history since our last visit reviewed. Allergies and medications reviewed and updated.  Review of Systems  Constitutional: Negative for fever or weight change.   Respiratory: Negative for cough and shortness of breath.   Cardiovascular: Negative for chest pain or palpitations.  Gastrointestinal: Negative for abdominal pain, no bowel changes.  Musculoskeletal: Negative for gait problem or joint swelling.  Skin: Negative for rash.  Neurological: Negative for dizziness or headache.  No other specific complaints in a complete review of systems (except as listed in HPI above).      Objective:    There were no vitals taken for this visit.  Wt Readings from Last 3 Encounters:  06/13/23 129 lb (58.5 kg)  11/16/22 114 lb 8 oz (51.9 kg)  11/08/22 126 lb 11.2 oz (57.5 kg)    Physical Exam  Constitutional: Patient appears well-developed and well-nourished. Obese *** No distress.  HEENT: head atraumatic, normocephalic, pupils equal and reactive to light, ears ***, neck supple, throat within normal limits Cardiovascular: Normal rate, regular rhythm and normal heart sounds.  No murmur heard. No BLE edema. Pulmonary/Chest: Effort normal and breath sounds normal. No respiratory distress. Abdominal: Soft.  There is no  tenderness. Psychiatric: Patient has a normal mood and affect. behavior is normal. Judgment and thought content normal.  Results for orders placed or performed in visit on 06/13/23  TSH  Result Value Ref Range   TSH 0.817 0.450 - 4.500 uIU/mL  CBC  Result Value Ref Range   WBC 4.9 3.4 - 10.8 x10E3/uL   RBC 5.32 (H) 3.77 - 5.28 x10E6/uL   Hemoglobin 15.2 11.1 - 15.9 g/dL   Hematocrit 78.2 95.6 - 46.6 %   MCV 87 79 - 97 fL   MCH 28.6 26.6 - 33.0 pg   MCHC 32.7 31.5 - 35.7 g/dL   RDW 21.3 08.6 - 57.8 %   Platelets 215 150 - 450 x10E3/uL  Lipid Profile  Result Value Ref Range   Cholesterol, Total 133 100 - 199 mg/dL   Triglycerides 50 0 - 149 mg/dL   HDL 61 >46 mg/dL   VLDL Cholesterol Cal 11 5 - 40 mg/dL   LDL Chol Calc (NIH) 61 0 - 99 mg/dL   Chol/HDL Ratio 2.2 0.0 - 4.4 ratio  HgB A1c  Result Value Ref Range   Hgb A1c MFr Bld  5.3 4.8 - 5.6 %   Est. average glucose Bld gHb Est-mCnc 105 mg/dL  Comp Met (CMET)  Result Value Ref Range   Glucose 88 70 - 99 mg/dL   BUN 9 6 - 20 mg/dL   Creatinine, Ser 9.62 0.57 - 1.00 mg/dL   eGFR 952 >84 XL/KGM/0.10   BUN/Creatinine Ratio 13 9 - 23   Sodium 139 134 - 144 mmol/L   Potassium 4.8 3.5 - 5.2 mmol/L   Chloride 105 96 - 106 mmol/L   CO2 21 20 - 29 mmol/L   Calcium 9.4 8.7 - 10.2 mg/dL   Total Protein 6.3 6.0 - 8.5 g/dL   Albumin 4.5 4.0 - 5.0 g/dL   Globulin, Total 1.8 1.5 - 4.5 g/dL   Bilirubin Total 0.5 0.0 - 1.2 mg/dL   Alkaline Phosphatase 90 44 - 121 IU/L   AST 15 0 - 40 IU/L   ALT 16 0 - 32 IU/L      Assessment & Plan:   Problem List Items Addressed This Visit   None    Follow up plan: No follow-ups on file.

## 2023-07-29 ENCOUNTER — Encounter: Payer: Self-pay | Admitting: Nurse Practitioner

## 2023-07-29 ENCOUNTER — Ambulatory Visit: Payer: Medicaid Other

## 2023-08-01 ENCOUNTER — Other Ambulatory Visit: Payer: Self-pay

## 2023-08-01 ENCOUNTER — Ambulatory Visit: Payer: Medicaid Other

## 2023-08-01 ENCOUNTER — Ambulatory Visit (INDEPENDENT_AMBULATORY_CARE_PROVIDER_SITE_OTHER): Payer: Medicaid Other | Admitting: Nurse Practitioner

## 2023-08-01 VITALS — BP 122/70 | HR 95 | Temp 98.2°F | Resp 18 | Ht 63.0 in | Wt 129.8 lb

## 2023-08-01 DIAGNOSIS — H6123 Impacted cerumen, bilateral: Secondary | ICD-10-CM

## 2023-08-01 DIAGNOSIS — Z3042 Encounter for surveillance of injectable contraceptive: Secondary | ICD-10-CM

## 2023-08-01 DIAGNOSIS — Z309 Encounter for contraceptive management, unspecified: Secondary | ICD-10-CM

## 2023-08-01 MED ORDER — MEDROXYPROGESTERONE ACETATE 150 MG/ML IM SUSY
150.0000 mg | PREFILLED_SYRINGE | INTRAMUSCULAR | 5 refills | Status: DC
Start: 2023-08-01 — End: 2024-08-03

## 2023-08-01 NOTE — Progress Notes (Signed)
BP 122/70   Pulse 95   Temp 98.2 F (36.8 C) (Oral)   Resp 18   Ht 5\' 3"  (1.6 m)   Wt 129 lb 12.8 oz (58.9 kg)   SpO2 98%   BMI 22.99 kg/m    Subjective:    Patient ID: Sherry Floyd, female    DOB: Dec 03, 2000, 22 y.o.   MRN: [id number]  HPI: Sherry Floyd is a 22 y.o. female  Chief Complaint  Patient presents with   Ear Fullness   Contraception   Cerumen impaction: patient reports her ears feel clogged. She reports she has been using Qtips.  Recommend discontinue use. Upon exam she has impacted cerumen.  Discussed ear lavage and patient would like to proceed.  Patient tolerated procedure well. TMs clear.   Verbal consent given Possible side effects discussed with patient Ears were  lavaged with warm water and peroxide  Patient tolerated procedure well No complications    Patient also here to receive her depo shot.      08/01/2023    2:58 PM 11/16/2022    3:48 PM 11/08/2022    4:11 PM 10/07/2022    3:04 PM 10/07/2022    2:58 PM  Depression screen PHQ 2/9  Decreased Interest 0 0 0 0 0  Down, Depressed, Hopeless 0 0 0 1 0  PHQ - 2 Score 0 0 0 1 0  Altered sleeping   0 0   Tired, decreased energy   0 1   Change in appetite   0 0   Feeling bad or failure about yourself    0 1   Trouble concentrating   0 0   Moving slowly or fidgety/restless   0 0   Suicidal thoughts   0 0   PHQ-9 Score   0 3   Difficult doing work/chores   Not difficult at all Not difficult at all     Relevant past medical, surgical, family and social history reviewed and updated as indicated. Interim medical history since our last visit reviewed. Allergies and medications reviewed and updated.  Review of Systems  Ten systems reviewed and is negative except as mentioned in HPI       Objective:    BP 122/70   Pulse 95   Temp 98.2 F (36.8 C) (Oral)   Resp 18   Ht 5\' 3"  (1.6 m)   Wt 129 lb 12.8 oz (58.9 kg)   SpO2 98%   BMI 22.99 kg/m   Wt Readings from Last 3 Encounters:   08/01/23 129 lb 12.8 oz (58.9 kg)  06/13/23 129 lb (58.5 kg)  11/16/22 114 lb 8 oz (51.9 kg)    Physical Exam  Constitutional: Patient appears well-developed and well-nourished.  No distress.  HEENT: head atraumatic, normocephalic, pupils equal and reactive to light, ears cerumen impaction,  after lavage TMs clear, neck supple, throat within normal limits Cardiovascular: Normal rate, regular rhythm and normal heart sounds.  No murmur heard. No BLE edema. Pulmonary/Chest: Effort normal and breath sounds normal. No respiratory distress. Abdominal: Soft.  There is no tenderness. Psychiatric: Patient has a normal mood and affect. behavior is normal. Judgment and thought content normal.  Results for orders placed or performed in visit on 06/13/23  TSH  Result Value Ref Range   TSH 0.817 0.450 - 4.500 uIU/mL  CBC  Result Value Ref Range   WBC 4.9 3.4 - 10.8 x10E3/uL   RBC 5.32 (H) 3.77 - 5.28 x10E6/uL  Hemoglobin 15.2 11.1 - 15.9 g/dL   Hematocrit 16.1 09.6 - 46.6 %   MCV 87 79 - 97 fL   MCH 28.6 26.6 - 33.0 pg   MCHC 32.7 31.5 - 35.7 g/dL   RDW 04.5 40.9 - 81.1 %   Platelets 215 150 - 450 x10E3/uL  Lipid Profile  Result Value Ref Range   Cholesterol, Total 133 100 - 199 mg/dL   Triglycerides 50 0 - 149 mg/dL   HDL 61 >91 mg/dL   VLDL Cholesterol Cal 11 5 - 40 mg/dL   LDL Chol Calc (NIH) 61 0 - 99 mg/dL   Chol/HDL Ratio 2.2 0.0 - 4.4 ratio  HgB A1c  Result Value Ref Range   Hgb A1c MFr Bld 5.3 4.8 - 5.6 %   Est. average glucose Bld gHb Est-mCnc 105 mg/dL  Comp Met (CMET)  Result Value Ref Range   Glucose 88 70 - 99 mg/dL   BUN 9 6 - 20 mg/dL   Creatinine, Ser 4.78 0.57 - 1.00 mg/dL   eGFR 295 >62 ZH/YQM/5.78   BUN/Creatinine Ratio 13 9 - 23   Sodium 139 134 - 144 mmol/L   Potassium 4.8 3.5 - 5.2 mmol/L   Chloride 105 96 - 106 mmol/L   CO2 21 20 - 29 mmol/L   Calcium 9.4 8.7 - 10.2 mg/dL   Total Protein 6.3 6.0 - 8.5 g/dL   Albumin 4.5 4.0 - 5.0 g/dL   Globulin, Total  1.8 1.5 - 4.5 g/dL   Bilirubin Total 0.5 0.0 - 1.2 mg/dL   Alkaline Phosphatase 90 44 - 121 IU/L   AST 15 0 - 40 IU/L   ALT 16 0 - 32 IU/L      Assessment & Plan:   Problem List Items Addressed This Visit   None Visit Diagnoses     Bilateral impacted cerumen    -  Primary   ear lavage performed,  patient tolerated well,  TMs clear after lavage   Relevant Orders   Ear Lavage   Encounter for contraceptive management, unspecified type       Relevant Medications   medroxyPROGESTERone Acetate 150 MG/ML SUSY        Follow up plan: Return if symptoms worsen or fail to improve.

## 2023-08-23 ENCOUNTER — Encounter: Payer: Self-pay | Admitting: Nurse Practitioner

## 2023-10-24 ENCOUNTER — Ambulatory Visit (INDEPENDENT_AMBULATORY_CARE_PROVIDER_SITE_OTHER): Payer: Medicaid Other | Admitting: Nurse Practitioner

## 2023-10-24 ENCOUNTER — Encounter: Payer: Self-pay | Admitting: Nurse Practitioner

## 2023-10-24 VITALS — BP 114/72 | HR 86 | Temp 97.9°F | Resp 14 | Ht 62.0 in | Wt 136.8 lb

## 2023-10-24 DIAGNOSIS — H6123 Impacted cerumen, bilateral: Secondary | ICD-10-CM

## 2023-10-24 DIAGNOSIS — Z23 Encounter for immunization: Secondary | ICD-10-CM | POA: Diagnosis not present

## 2023-10-24 NOTE — Progress Notes (Signed)
BP 114/72 (BP Location: Right Arm, Patient Position: Sitting, Cuff Size: Normal)   Pulse 86   Temp 97.9 F (36.6 C) (Oral)   Resp 14   Ht 5\' 2"  (1.575 m) Comment: per patient  Wt 136 lb 12.8 oz (62.1 kg)   SpO2 99%   BMI 25.02 kg/m    Subjective:    Patient ID: Sherry Floyd, female    DOB: 10-Apr-2001, 22 y.o.   MRN: [id number]  HPI: Sherry Floyd is a 22 y.o. female  Chief Complaint  Patient presents with   Follow-up   Ear Fullness    bilateral   Discussed the use of AI scribe software for clinical note transcription with the patient, who gave verbal consent to proceed.  History of Present Illness   The patient presents with persistent itching in the right ear, which has been ongoing for awhile. She reports using approximately five Q-tips daily to alleviate the discomfort, but this has not provided relief.  She has tried various remedies without success. She has tried irrigation at home and ear drops.  The patient also has narrow ear canals, which may be contributing to the issue.   Verbal consent given Possible side effects discussed with patient Ears were  lavaged with warm water and peroxide  Patient tolerated procedure well No complications      10/24/2023    1:42 PM 10/24/2023    1:40 PM 08/01/2023    2:58 PM 11/16/2022    3:48 PM 11/08/2022    4:11 PM  Depression screen PHQ 2/9  Decreased Interest 0 0 0 0 0  Down, Depressed, Hopeless 0 0 0 0 0  PHQ - 2 Score 0 0 0 0 0  Altered sleeping 0 0   0  Tired, decreased energy 2 0   0  Change in appetite 3 0   0  Feeling bad or failure about yourself  1 0   0  Trouble concentrating 0 0   0  Moving slowly or fidgety/restless 0 0   0  Suicidal thoughts 0 0   0  PHQ-9 Score 6 0   0  Difficult doing work/chores Not difficult at all    Not difficult at all    Relevant past medical, surgical, family and social history reviewed and updated as indicated. Interim medical history since our last visit  reviewed. Allergies and medications reviewed and updated.  Review of Systems  Ten systems reviewed and is negative except as mentioned in HPI       Objective:    BP 114/72 (BP Location: Right Arm, Patient Position: Sitting, Cuff Size: Normal)   Pulse 86   Temp 97.9 F (36.6 C) (Oral)   Resp 14   Ht 5\' 2"  (1.575 m) Comment: per patient  Wt 136 lb 12.8 oz (62.1 kg)   SpO2 99%   BMI 25.02 kg/m   Wt Readings from Last 3 Encounters:  10/24/23 136 lb 12.8 oz (62.1 kg)  08/01/23 129 lb 12.8 oz (58.9 kg)  06/13/23 129 lb (58.5 kg)    Physical Exam  Constitutional: Patient appears well-developed and well-nourished.  No distress.  HEENT: head atraumatic, normocephalic, pupils equal and reactive to light, ears impacted cerumen, TMs clear s/p irrigation neck supple Cardiovascular: Normal rate, regular rhythm and normal heart sounds.  No murmur heard. No BLE edema. Pulmonary/Chest: Effort normal and breath sounds normal. No respiratory distress. Abdominal: Soft.  There is no tenderness. Psychiatric: Patient has a normal mood  and affect. behavior is normal. Judgment and thought content normal.  Results for orders placed or performed in visit on 06/13/23  TSH  Result Value Ref Range   TSH 0.817 0.450 - 4.500 uIU/mL  CBC  Result Value Ref Range   WBC 4.9 3.4 - 10.8 x10E3/uL   RBC 5.32 (H) 3.77 - 5.28 x10E6/uL   Hemoglobin 15.2 11.1 - 15.9 g/dL   Hematocrit 10.2 72.5 - 46.6 %   MCV 87 79 - 97 fL   MCH 28.6 26.6 - 33.0 pg   MCHC 32.7 31.5 - 35.7 g/dL   RDW 36.6 44.0 - 34.7 %   Platelets 215 150 - 450 x10E3/uL  Lipid Profile  Result Value Ref Range   Cholesterol, Total 133 100 - 199 mg/dL   Triglycerides 50 0 - 149 mg/dL   HDL 61 >42 mg/dL   VLDL Cholesterol Cal 11 5 - 40 mg/dL   LDL Chol Calc (NIH) 61 0 - 99 mg/dL   Chol/HDL Ratio 2.2 0.0 - 4.4 ratio  HgB A1c  Result Value Ref Range   Hgb A1c MFr Bld 5.3 4.8 - 5.6 %   Est. average glucose Bld gHb Est-mCnc 105 mg/dL  Comp Met  (CMET)  Result Value Ref Range   Glucose 88 70 - 99 mg/dL   BUN 9 6 - 20 mg/dL   Creatinine, Ser 5.95 0.57 - 1.00 mg/dL   eGFR 638 >75 IE/PPI/9.51   BUN/Creatinine Ratio 13 9 - 23   Sodium 139 134 - 144 mmol/L   Potassium 4.8 3.5 - 5.2 mmol/L   Chloride 105 96 - 106 mmol/L   CO2 21 20 - 29 mmol/L   Calcium 9.4 8.7 - 10.2 mg/dL   Total Protein 6.3 6.0 - 8.5 g/dL   Albumin 4.5 4.0 - 5.0 g/dL   Globulin, Total 1.8 1.5 - 4.5 g/dL   Bilirubin Total 0.5 0.0 - 1.2 mg/dL   Alkaline Phosphatase 90 44 - 121 IU/L   AST 15 0 - 40 IU/L   ALT 16 0 - 32 IU/L      Assessment & Plan:   Problem List Items Addressed This Visit   None Visit Diagnoses     Bilateral impacted cerumen    -  Primary   Relevant Orders   Ambulatory referral to ENT   Ear Lavage   Need for immunization against influenza       Relevant Orders   Flu vaccine trivalent PF, 6mos and older(Flulaval,Afluria,Fluarix,Fluzone) (Completed)   Need for Tdap vaccination       Relevant Orders   Tdap vaccine greater than or equal to 7yo IM (Completed)       Assessment and Plan    Impacted Cerumen Persistent itching and use of Q-tips leading to cerumen impaction in the right ear. Left ear appears clear. Noted narrow ear canals which may contribute to the issue. -cerumen impaction irrigation completed -Continue use of Debrox drops to soften remaining cerumen. -Referral to ENT for further evaluation and management due to recurrent issues.        Follow up plan: Return if symptoms worsen or fail to improve.

## 2024-01-19 ENCOUNTER — Telehealth: Payer: Self-pay

## 2024-01-19 ENCOUNTER — Other Ambulatory Visit: Payer: Self-pay | Admitting: Nurse Practitioner

## 2024-01-19 DIAGNOSIS — Z13220 Encounter for screening for lipoid disorders: Secondary | ICD-10-CM

## 2024-01-19 DIAGNOSIS — Z130 Encounter for screening for diseases of the blood and blood-forming organs and certain disorders involving the immune mechanism: Secondary | ICD-10-CM

## 2024-01-19 DIAGNOSIS — Z131 Encounter for screening for diabetes mellitus: Secondary | ICD-10-CM

## 2024-01-19 NOTE — Telephone Encounter (Signed)
 Patient aware.

## 2024-01-19 NOTE — Telephone Encounter (Signed)
 Copied from CRM [id number]. Topic: Clinical - Lab/Test Results >> Jan 19, 2024  2:50 PM Yolanda T wrote: Reason for CRM: patient is requesting routine labs and need provider to place an order

## 2024-01-23 ENCOUNTER — Encounter: Payer: Self-pay | Admitting: Nurse Practitioner

## 2024-01-23 ENCOUNTER — Ambulatory Visit: Payer: Medicaid Other | Admitting: Nurse Practitioner

## 2024-01-23 VITALS — BP 116/62 | HR 77 | Resp 18 | Ht 62.0 in | Wt 139.9 lb

## 2024-01-23 DIAGNOSIS — Z309 Encounter for contraceptive management, unspecified: Secondary | ICD-10-CM

## 2024-01-23 DIAGNOSIS — F411 Generalized anxiety disorder: Secondary | ICD-10-CM

## 2024-01-23 DIAGNOSIS — R3915 Urgency of urination: Secondary | ICD-10-CM | POA: Diagnosis not present

## 2024-01-23 DIAGNOSIS — Z3042 Encounter for surveillance of injectable contraceptive: Secondary | ICD-10-CM | POA: Diagnosis not present

## 2024-01-23 LAB — POCT URINALYSIS DIPSTICK
Bilirubin, UA: NEGATIVE
Blood, UA: NEGATIVE
Glucose, UA: NEGATIVE
Ketones, UA: NEGATIVE
Leukocytes, UA: NEGATIVE
Nitrite, UA: NEGATIVE
Odor: NORMAL
Protein, UA: NEGATIVE
Spec Grav, UA: 1.02 (ref 1.010–1.025)
Urobilinogen, UA: 0.2 U/dL
pH, UA: 6 (ref 5.0–8.0)

## 2024-01-23 LAB — POCT URINE PREGNANCY: Preg Test, Ur: NEGATIVE

## 2024-01-23 MED ORDER — MEDROXYPROGESTERONE ACETATE 150 MG/ML IM SUSY
150.0000 mg | PREFILLED_SYRINGE | INTRAMUSCULAR | Status: DC
Start: 2024-01-23 — End: 2024-05-15
  Administered 2024-01-23: 150 mg via INTRAMUSCULAR

## 2024-01-23 NOTE — Progress Notes (Signed)
 BP 116/62   Pulse 77   Resp 18   Ht 5\' 2"  (1.575 m)   Wt 139 lb 14.4 oz (63.5 kg)   SpO2 98%   BMI 25.59 kg/m    Subjective:    Patient ID: Sherry Floyd, female    DOB: 2001-06-19, 23 y.o.   MRN: [id number]  HPI: Sherry Floyd is a 23 y.o. female  Chief Complaint  Patient presents with   Medical Management of Chronic Issues   HPI  Patient returns to clinic for 3 month birth control injection. Patient reports that she is tolerating medication well, denies adverse effects, and denies any episodes of breakthrough bleeding. Patient expresses concerns regarding intermittent episodes of urinary urgency for, denies pain with urination, or increased frequency. Pt states that symptoms have occurred infrequently for a few weeks. She also reports acid reflux that is controlled with tums.   Anxiety:  is managed with lexapro and buspar. She reports she is doing well. No changes in plan of care. PHQ9 an GAD scores negative.      01/23/2024    1:36 PM 01/23/2024    1:33 PM 10/24/2023    1:42 PM  Depression screen PHQ 2/9  Decreased Interest 0 0 0  Down, Depressed, Hopeless 0 0 0  PHQ - 2 Score 0 0 0  Altered sleeping 0 0 0  Tired, decreased energy 1 1 2   Change in appetite 0 1 3  Feeling bad or failure about yourself  0 0 1  Trouble concentrating 0 0 0  Moving slowly or fidgety/restless 0 0 0  Suicidal thoughts 0 0 0  PHQ-9 Score 1 2 6   Difficult doing work/chores Not difficult at all Not difficult at all Not difficult at all       01/23/2024    1:33 PM 10/24/2023    1:42 PM 09/17/2022    8:25 AM 08/19/2022    1:13 PM  GAD 7 : Generalized Anxiety Score  Nervous, Anxious, on Edge 1 1 1 1   Control/stop worrying 0 1 1 2   Worry too much - different things 0 1 1 3   Trouble relaxing 0 1 1 2   Restless 0 1 1 0  Easily annoyed or irritable 0 1 1 2   Afraid - awful might happen 0 1 0 0  Total GAD 7 Score 1 7 6 10   Anxiety Difficulty Not difficult at all Somewhat difficult Not  difficult at all Somewhat difficult     Relevant past medical, surgical, family and social history reviewed and updated as indicated. Interim medical history since our last visit reviewed. Allergies and medications reviewed and updated.  Review of Systems Ten systems reviewed and is negative except as mentioned in HPI     Objective:    BP 116/62   Pulse 77   Resp 18   Ht 5\' 2"  (1.575 m)   Wt 139 lb 14.4 oz (63.5 kg)   SpO2 98%   BMI 25.59 kg/m    Wt Readings from Last 3 Encounters:  01/23/24 139 lb 14.4 oz (63.5 kg)  10/24/23 136 lb 12.8 oz (62.1 kg)  08/01/23 129 lb 12.8 oz (58.9 kg)    Physical Exam Vitals reviewed.  Constitutional:      Appearance: Normal appearance.  HENT:     Head: Normocephalic.  Cardiovascular:     Rate and Rhythm: Normal rate and regular rhythm.  Pulmonary:     Effort: Pulmonary effort is normal.  Breath sounds: Normal breath sounds.  Abdominal:     Tenderness: There is no right CVA tenderness or left CVA tenderness.  Musculoskeletal:        General: Normal range of motion.  Skin:    General: Skin is warm and dry.  Neurological:     General: No focal deficit present.     Mental Status: She is alert and oriented to person, place, and time. Mental status is at baseline.  Psychiatric:        Mood and Affect: Mood normal.        Behavior: Behavior normal.        Thought Content: Thought content normal.        Judgment: Judgment normal.     Results for orders placed or performed in visit on 06/13/23  TSH   Collection Time: 06/13/23  2:15 PM  Result Value Ref Range   TSH 0.817 0.450 - 4.500 uIU/mL  CBC   Collection Time: 06/13/23  2:15 PM  Result Value Ref Range   WBC 4.9 3.4 - 10.8 x10E3/uL   RBC 5.32 (H) 3.77 - 5.28 x10E6/uL   Hemoglobin 15.2 11.1 - 15.9 g/dL   Hematocrit 54.0 98.1 - 46.6 %   MCV 87 79 - 97 fL   MCH 28.6 26.6 - 33.0 pg   MCHC 32.7 31.5 - 35.7 g/dL   RDW 19.1 47.8 - 29.5 %   Platelets 215 150 - 450 x10E3/uL   Lipid Profile   Collection Time: 06/13/23  2:15 PM  Result Value Ref Range   Cholesterol, Total 133 100 - 199 mg/dL   Triglycerides 50 0 - 149 mg/dL   HDL 61 >62 mg/dL   VLDL Cholesterol Cal 11 5 - 40 mg/dL   LDL Chol Calc (NIH) 61 0 - 99 mg/dL   Chol/HDL Ratio 2.2 0.0 - 4.4 ratio  HgB A1c   Collection Time: 06/13/23  2:15 PM  Result Value Ref Range   Hgb A1c MFr Bld 5.3 4.8 - 5.6 %   Est. average glucose Bld gHb Est-mCnc 105 mg/dL  Comp Met (CMET)   Collection Time: 06/13/23  2:15 PM  Result Value Ref Range   Glucose 88 70 - 99 mg/dL   BUN 9 6 - 20 mg/dL   Creatinine, Ser 1.30 0.57 - 1.00 mg/dL   eGFR 865 >78 IO/NGE/9.52   BUN/Creatinine Ratio 13 9 - 23   Sodium 139 134 - 144 mmol/L   Potassium 4.8 3.5 - 5.2 mmol/L   Chloride 105 96 - 106 mmol/L   CO2 21 20 - 29 mmol/L   Calcium 9.4 8.7 - 10.2 mg/dL   Total Protein 6.3 6.0 - 8.5 g/dL   Albumin 4.5 4.0 - 5.0 g/dL   Globulin, Total 1.8 1.5 - 4.5 g/dL   Bilirubin Total 0.5 0.0 - 1.2 mg/dL   Alkaline Phosphatase 90 44 - 121 IU/L   AST 15 0 - 40 IU/L   ALT 16 0 - 32 IU/L       Assessment & Plan:   Problem List Items Addressed This Visit       Other   Generalized anxiety disorder   Patient is currently taking Buspar 7.5mg  TID and Lexapro 20 mg daily. Pt denies adverse reactions and reports medications as effective. Will continue current dosage and reassess next follow up visit      Other Visit Diagnoses       Urinary urgency    -  Primary   ua  dip negative.  will obtain cbc , cmp and a1c   Relevant Orders   CBC with Differential/Platelet   COMPLETE METABOLIC PANEL WITH GFR   Hemoglobin A1c   POCT urinalysis dipstick   POCT Pregnancy, Urine     Encounter for contraceptive management, unspecified type       depo shot given, and refills ordered. u preg negative   Relevant Medications   medroxyPROGESTERone Acetate SUSY 150 mg   Other Relevant Orders   POCT Pregnancy, Urine           Follow up  plan: Return in about 6 months (around 07/25/2024) for follow up.

## 2024-01-23 NOTE — Assessment & Plan Note (Signed)
 Patient is currently taking Buspar 7.5mg  TID and Lexapro 20 mg daily. Pt denies adverse reactions and reports medications as effective. Will continue current dosage and reassess next follow up visit

## 2024-04-09 ENCOUNTER — Ambulatory Visit

## 2024-05-14 ENCOUNTER — Ambulatory Visit: Payer: Self-pay

## 2024-05-14 NOTE — Telephone Encounter (Signed)
 FYI Only or Action Required?: FYI only for provider.  Patient was last seen in primary care on 01/23/2024 by Gareth Mliss FALCON, FNP. Called Nurse Triage reporting Chest Pain. Symptoms began several days ago. Interventions attempted: OTC medications: Pepto Bismol. Symptoms are: unchanged.  Triage Disposition: See Physician Within 24 Hours- pt scheduled for 6/24 with Dr. Glenard,  Patient/caregiver understands and will follow disposition?: Yes         Copied from CRM [id number]. Topic: Clinical - Red Word Triage >> May 14, 2024  1:55 PM Sasha H wrote: Red Word that prompted transfer to Nurse Triage: Pt has been having chest on the left side of her chest when she lays down since Saturday night. Reason for Disposition  [1] Chest pain lasts < 5 minutes AND [2] NO chest pain or cardiac symptoms (e.g., breathing difficulty, sweating) now  (Exception: Chest pains that last only a few seconds.)  Answer Assessment - Initial Assessment Questions 1. LOCATION: Where does it hurt?       Left side,  2. RADIATION: Does the pain go anywhere else? (e.g., into neck, jaw, arms, back)     Does not radiate  3. ONSET: When did the chest pain begin? (Minutes, hours or days)      2 days ago  4. PATTERN: Does the pain come and go, or has it been constant since it started?  Does it get worse with exertion?      Off and on, only when laying down  5. DURATION: How long does it last (e.g., seconds, minutes, hours)     Pain usually last when laying down  6. SEVERITY: How bad is the pain?  (e.g., Scale 1-10; mild, moderate, or severe)    - MILD (1-3): doesn't interfere with normal activities     - MODERATE (4-7): interferes with normal activities or awakens from sleep    - SEVERE (8-10): excruciating pain, unable to do any normal activities       Moderate pain, took some pepto bismol but it didn't help  7. CARDIAC RISK FACTORS: Do you have any history of heart problems or risk factors for heart  disease? (e.g., angina, prior heart attack; diabetes, high blood pressure, high cholesterol, smoker, or strong family history of heart disease)     No  8. PULMONARY RISK FACTORS: Do you have any history of lung disease?  (e.g., blood clots in lung, asthma, emphysema, birth control pills)     No  9. CAUSE: What do you think is causing the chest pain?     Unsure  10. OTHER SYMPTOMS: Do you have any other symptoms? (e.g., dizziness, nausea, vomiting, sweating, fever, difficulty breathing, cough)       No  11. PREGNANCY: Is there any chance you are pregnant? When was your last menstrual period?       Unsure, LMP- unsure of last date  Protocols used: Chest Pain-A-AH

## 2024-05-15 ENCOUNTER — Ambulatory Visit: Admitting: Family Medicine

## 2024-05-15 ENCOUNTER — Other Ambulatory Visit: Payer: Self-pay | Admitting: Family Medicine

## 2024-05-15 ENCOUNTER — Encounter: Payer: Self-pay | Admitting: Family Medicine

## 2024-05-15 VITALS — BP 120/74 | HR 95 | Resp 16 | Ht 62.0 in | Wt 130.1 lb

## 2024-05-15 DIAGNOSIS — R0789 Other chest pain: Secondary | ICD-10-CM | POA: Diagnosis not present

## 2024-05-15 MED ORDER — BACLOFEN 10 MG PO TABS
10.0000 mg | ORAL_TABLET | Freq: Three times a day (TID) | ORAL | 0 refills | Status: AC
Start: 2024-05-15 — End: ?

## 2024-05-15 MED ORDER — CELECOXIB 100 MG PO CAPS: 100.0000 mg | ORAL_CAPSULE | Freq: Two times a day (BID) | ORAL | 0 refills | Status: AC

## 2024-05-15 NOTE — Progress Notes (Signed)
 Name: Sherry Floyd   MRN: [id number]    DOB: 05-25-2001   Date:05/15/2024       Progress Note  Subjective  Chief Complaint  Chief Complaint  Patient presents with   Chest Pain    chest on the left side of her chest when she lays down since Saturday night, pt states today its light feels like someone is sitting on her chest, radiates to rib cage area    Discussed the use of AI scribe software for clinical note transcription with the patient, who gave verbal consent to proceed.  History of Present Illness Sherry Floyd is a 23 year old female who presents with chest pain.  She has been experiencing chest pain that began on Saturday night while at work. The pain is deep, located under her left breast, and extends from the rib cage upwards. It is exacerbated when lying on her left side and is associated with a heavy pressure sensation when sitting up. The pain does not radiate and is absent when standing. She describes the pain as sharp, shooting, and stabbing when lying down, and as a heavy pressure when sitting up.  She does not recall any specific trauma or unusual activity prior to the onset of pain, but did experience increased pain after moving furniture with her boyfriend. She recalls a similar episode in the past, which she believes was diagnosed as inflammation of the heart lining, possibly pericarditis.  No recent heartburn, indigestion, nausea, vomiting, weight loss, or rashes. She has a history of anxiety for which she takes Lexapro  10 mg daily and Buspar  as needed. She also receives Depo-Provera  injections for contraception. She has tried Prevacid and Tums without relief of her symptoms.  During the review of symptoms, deep breaths cause slight pain, and coughing causes very light pain. No recent changes in her family or social situation.    Patient Active Problem List   Diagnosis Date Noted   Generalized anxiety disorder 05/20/2022    Social History   Tobacco Use    Smoking status: Never   Smokeless tobacco: Never  Substance Use Topics   Alcohol use: Yes     Current Outpatient Medications:    busPIRone  (BUSPAR ) 7.5 MG tablet, Take 1 tablet (7.5 mg total) by mouth 3 (three) times daily as needed., Disp: 90 tablet, Rfl: 1   escitalopram  (LEXAPRO ) 10 MG tablet, Take 10 mg by mouth daily., Disp: , Rfl:    medroxyPROGESTERone  Acetate 150 MG/ML SUSY, Inject 1 mL (150 mg total) into the muscle every 3 (three) months., Disp: 0.9 mL, Rfl: 5   escitalopram  (LEXAPRO ) 20 MG tablet, Take 20 mg by mouth every morning. (Patient not taking: Reported on 05/15/2024), Disp: , Rfl:   Current Facility-Administered Medications:    medroxyPROGESTERone  Acetate SUSY 150 mg, 150 mg, Intramuscular, Q90 days, Pender, Julie F, FNP, 150 mg at 01/23/24 1450   medroxyPROGESTERone  Acetate SUSY, , Intramuscular, Q90 days, Gareth Mliss FALCON, FNP, Given at 08/01/23 1507  No Known Allergies  ROS  Ten systems reviewed and is negative except as mentioned in HPI    Objective  Vitals:   05/15/24 0949  BP: 120/74  Pulse: 95  Resp: 16  SpO2: 96%  Weight: 130 lb 1.6 oz (59 kg)  Height: 5' 2 (1.575 m)    Body mass index is 23.8 kg/m.  Physical Exam  Constitutional: Patient appears well-developed and well-nourished.  No distress.  HEENT: head atraumatic, normocephalic, pupils equal and reactive to light,neck supple Cardiovascular:  Normal rate, regular rhythm and normal heart sounds.  No murmur heard. No BLE edema. Pulmonary/Chest: Effort normal and breath sounds normal. No respiratory distress. Chest wall pain during palpation, no rashes, pain with rom of left arm Abdominal: Soft.  There is no tenderness. Psychiatric: Patient has a normal mood and affect. behavior is normal. Judgment and thought content normal.     Assessment & Plan Chest wall pain Intermittent left chest wall pain likely musculoskeletal in origin. Differential includes musculoskeletal pain versus  pericarditis. Lack of pericardial rub and normal lung exam support musculoskeletal etiology. - Prescribed baclofen in the evening for muscle relaxation, cautioning drowsiness. - Prescribed Celebrex with food to reduce inflammation and pain, minimizing gastrointestinal side effects. - Instructed to monitor symptoms and contact clinic if no improvement in 24-48 hours. - Reassured regarding musculoskeletal nature and expected improvement with treatment.  Anxiety Managed with Lexapro  10 mg and Buspar  as needed.  General Health Maintenance Recommended scheduling a physical examination for routine health maintenance and vaccinations. - Schedule a physical examination for routine health maintenance and vaccinations.  Follow-Up Advised to return as needed if symptoms persist or worsen. - Return to clinic as needed if symptoms persist or worsen.     There are no diagnoses linked to this encounter.

## 2024-05-28 ENCOUNTER — Encounter: Admitting: Nurse Practitioner

## 2024-06-11 ENCOUNTER — Other Ambulatory Visit (HOSPITAL_COMMUNITY)
Admission: RE | Admit: 2024-06-11 | Discharge: 2024-06-11 | Disposition: A | Discharge status: Home / Self Care | Source: Ambulatory Visit | Attending: Nurse Practitioner | Admitting: Nurse Practitioner

## 2024-06-11 ENCOUNTER — Encounter: Payer: Self-pay | Admitting: Nurse Practitioner

## 2024-06-11 ENCOUNTER — Ambulatory Visit (INDEPENDENT_AMBULATORY_CARE_PROVIDER_SITE_OTHER): Admitting: Nurse Practitioner

## 2024-06-11 VITALS — BP 106/64 | HR 89 | Temp 98.4°F | Resp 18 | Ht 62.0 in | Wt 127.0 lb

## 2024-06-11 DIAGNOSIS — N23 Unspecified renal colic: Secondary | ICD-10-CM

## 2024-06-11 LAB — POCT URINALYSIS DIPSTICK
Bilirubin, UA: NEGATIVE
Glucose, UA: NEGATIVE
Ketones, UA: NEGATIVE
Nitrite, UA: NEGATIVE
Odor: NORMAL
Protein, UA: NEGATIVE
Spec Grav, UA: 1.02 (ref 1.010–1.025)
Urobilinogen, UA: 0.2 U/dL
pH, UA: 6 (ref 5.0–8.0)

## 2024-06-11 MED ORDER — CEPHALEXIN 500 MG PO CAPS
500.0000 mg | ORAL_CAPSULE | Freq: Two times a day (BID) | ORAL | 0 refills | Status: AC
Start: 2024-06-11 — End: 2024-06-16

## 2024-06-11 NOTE — Progress Notes (Signed)
 BP 106/64   Pulse 89   Temp 98.4 F (36.9 C)   Resp 18   Ht 5' 2 (1.575 m)   Wt 127 lb (57.6 kg)   SpO2 98%   BMI 23.23 kg/m    Subjective:    Patient ID: Sherry Floyd, female    DOB: 2001/05/29, 23 y.o.   MRN: [id number]  HPI: Sherry Floyd is a 23 y.o. female  Chief Complaint  Patient presents with   Urinary Tract Infection    Onset 2 days symptoms include pressure    Discussed the use of AI scribe software for clinical note transcription with the patient, who gave verbal consent to proceed.  History of Present Illness Sherry Floyd is a 23 year old female who presents with dysuria, pressure, frequency, and urgency.  Lower urinary tract symptoms - Dysuria, pressure, urinary frequency, and urgency for the past two days - Describes sensation as 'pushing out like a five pound nonexistent baby' - No fever, back pain, or vaginal discharge - Urine dipstick positive for trace blood and trace leukocytes  Genitourinary history and sexual activity - Recent increase in sexual activity - No new sexual partners - Currently using Depo-Provera  for contraception without additional protection         05/15/2024    9:48 AM 01/23/2024    1:36 PM 01/23/2024    1:33 PM  Depression screen PHQ 2/9  Decreased Interest 0 0 0  Down, Depressed, Hopeless 0 0 0  PHQ - 2 Score 0 0 0  Altered sleeping 0 0 0  Tired, decreased energy 0 1 1  Change in appetite 0 0 1  Feeling bad or failure about yourself  0 0 0  Trouble concentrating 0 0 0  Moving slowly or fidgety/restless 0 0 0  Suicidal thoughts 0 0 0  PHQ-9 Score 0 1 2  Difficult doing work/chores Not difficult at all Not difficult at all Not difficult at all    Relevant past medical, surgical, family and social history reviewed and updated as indicated. Interim medical history since our last visit reviewed. Allergies and medications reviewed and updated.  Review of Systems  Ten systems reviewed and is negative  except as mentioned in HPI      Objective:     BP 106/64   Pulse 89   Temp 98.4 F (36.9 C)   Resp 18   Ht 5' 2 (1.575 m)   Wt 127 lb (57.6 kg)   SpO2 98%   BMI 23.23 kg/m    Wt Readings from Last 3 Encounters:  06/11/24 127 lb (57.6 kg)  05/15/24 130 lb 1.6 oz (59 kg)  01/23/24 139 lb 14.4 oz (63.5 kg)    Physical Exam Physical Exam GENERAL: Alert, cooperative, well developed, no acute distress. HEENT: Normocephalic, normal oropharynx, moist mucous membranes. CHEST: Clear to auscultation bilaterally, no wheezes, rhonchi, or crackles. CARDIOVASCULAR: Normal heart rate and rhythm, S1 and S2 normal without murmurs. ABDOMEN: Soft, non-tender, non-distended, without organomegaly, normal bowel sounds, no costovertebral angle tenderness. EXTREMITIES: No cyanosis or edema. NEUROLOGICAL: Cranial nerves grossly intact, moves all extremities without gross motor or sensory deficit.   Results for orders placed or performed in visit on 06/11/24  POCT Urinalysis Dipstick   Collection Time: 06/11/24 11:07 AM  Result Value Ref Range   Color, UA drk yellow    Clarity, UA clear    Glucose, UA Negative Negative   Bilirubin, UA neg    Ketones, UA  neg    Spec Grav, UA 1.020 1.010 - 1.025   Blood, UA trace    pH, UA 6.0 5.0 - 8.0   Protein, UA Negative Negative   Urobilinogen, UA 0.2 0.2 or 1.0 E.U./dL   Nitrite, UA neg    Leukocytes, UA Trace (A) Negative   Appearance dark    Odor normal           Assessment & Plan:   Problem List Items Addressed This Visit   None Visit Diagnoses       Urinary tract pain    -  Primary   Relevant Medications   cephALEXin  (KEFLEX ) 500 MG capsule   Other Relevant Orders   POCT Urinalysis Dipstick (Completed)   Urine Culture   Cervicovaginal ancillary only        Assessment and Plan Assessment & Plan Urinary tract infection, unspecified site Symptoms of dysuria, pressure, frequency, and urgency for two days. Urine dip shows trace  blood and leukocytes, indicating possible UTI. Differential includes vaginal bacterial infection due to recent increased sexual activity. No fever, back pain, or vaginal discharge reported. - Order urine culture - Prescribe antibiotics for UTI - Perform vaginal swab for bacterial infection - Review urine culture and vaginal swab results by Wednesday and adjust treatment if necessary        Follow up plan: Return if symptoms worsen or fail to improve.

## 2024-06-12 ENCOUNTER — Ambulatory Visit: Payer: Self-pay | Admitting: Nurse Practitioner

## 2024-06-12 LAB — CERVICOVAGINAL ANCILLARY ONLY
Bacterial Vaginitis (gardnerella): NEGATIVE
Candida Glabrata: NEGATIVE
Candida Vaginitis: NEGATIVE
Chlamydia: NEGATIVE
Comment: NEGATIVE
Comment: NEGATIVE
Comment: NEGATIVE
Comment: NEGATIVE
Comment: NEGATIVE
Comment: NORMAL
Neisseria Gonorrhea: NEGATIVE
Trichomonas: NEGATIVE

## 2024-06-13 LAB — URINE CULTURE
MICRO NUMBER:: 16725168
SPECIMEN QUALITY:: ADEQUATE

## 2024-07-12 ENCOUNTER — Ambulatory Visit

## 2024-08-02 ENCOUNTER — Other Ambulatory Visit: Payer: Self-pay | Admitting: Nurse Practitioner

## 2024-08-02 DIAGNOSIS — Z309 Encounter for contraceptive management, unspecified: Secondary | ICD-10-CM

## 2024-08-03 NOTE — Telephone Encounter (Signed)
 Requested medication (s) are due for refill today: yes  Requested medication (s) are on the active medication list: yes  Last refill:  08/01/23  Future visit scheduled: no  Notes to clinic:  Medication not assigned to a protocol, review manually.      Requested Prescriptions  Pending Prescriptions Disp Refills   medroxyPROGESTERone  Acetate 150 MG/ML SUSY [Pharmacy Med Name: MEDROXYPROGESTERONE  150MG /ML PF SYR] 1 mL     Sig: INJECT 150 MG IN THE MUSCLE EVERY 3 MONTHS.     Off-Protocol Failed - 08/03/2024  8:22 AM      Failed - Medication not assigned to a protocol, review manually.      Passed - Valid encounter within last 12 months    Recent Outpatient Visits           1 month ago Urinary tract pain   [facility] Gareth Clarity F, FNP   2 months ago Acute chest wall pain   [facility] Glenard Mire, MD   6 months ago Urinary urgency   [facility] Gareth Clarity FALCON, [place]

## 2024-12-12 ENCOUNTER — Ambulatory Visit: Admitting: Nurse Practitioner

## 2024-12-13 ENCOUNTER — Encounter: Payer: Self-pay | Admitting: Nurse Practitioner

## 2024-12-13 ENCOUNTER — Ambulatory Visit: Admitting: Nurse Practitioner

## 2024-12-13 ENCOUNTER — Other Ambulatory Visit (HOSPITAL_COMMUNITY)
Admission: RE | Admit: 2024-12-13 | Discharge: 2024-12-13 | Disposition: A | Source: Ambulatory Visit | Attending: Nurse Practitioner | Admitting: Nurse Practitioner

## 2024-12-13 VITALS — BP 102/70 | HR 97 | Temp 98.0°F | Ht 62.0 in | Wt 135.0 lb

## 2024-12-13 DIAGNOSIS — R300 Dysuria: Secondary | ICD-10-CM

## 2024-12-13 DIAGNOSIS — Z23 Encounter for immunization: Secondary | ICD-10-CM

## 2024-12-13 DIAGNOSIS — Z131 Encounter for screening for diabetes mellitus: Secondary | ICD-10-CM

## 2024-12-13 DIAGNOSIS — Z130 Encounter for screening for diseases of the blood and blood-forming organs and certain disorders involving the immune mechanism: Secondary | ICD-10-CM

## 2024-12-13 DIAGNOSIS — M940 Chondrocostal junction syndrome [Tietze]: Secondary | ICD-10-CM

## 2024-12-13 DIAGNOSIS — Z13220 Encounter for screening for lipoid disorders: Secondary | ICD-10-CM

## 2024-12-13 LAB — POCT URINALYSIS DIPSTICK
Bilirubin, UA: NEGATIVE
Blood, UA: NEGATIVE
Glucose, UA: NEGATIVE
Leukocytes, UA: NEGATIVE
Nitrite, UA: NEGATIVE
Protein, UA: NEGATIVE
Spec Grav, UA: 1.015
Urobilinogen, UA: 0.2 U/dL
pH, UA: 5

## 2024-12-13 LAB — CERVICOVAGINAL ANCILLARY ONLY
Bacterial Vaginitis (gardnerella): NEGATIVE
Candida Glabrata: NEGATIVE
Candida Vaginitis: NEGATIVE
Chlamydia: NEGATIVE
Comment: NEGATIVE
Comment: NEGATIVE
Comment: NEGATIVE
Comment: NEGATIVE
Comment: NEGATIVE
Comment: NORMAL
Neisseria Gonorrhea: NEGATIVE
Trichomonas: NEGATIVE

## 2024-12-13 LAB — POCT URINE PREGNANCY: Preg Test, Ur: NEGATIVE

## 2024-12-13 MED ORDER — PREDNISONE 10 MG (21) PO TBPK: ORAL_TABLET | ORAL | 0 refills | Status: AC

## 2024-12-13 NOTE — Progress Notes (Addendum)
 "  BP 102/70   Pulse 97   Temp 98 F (36.7 C)   Ht 5' 2 (1.575 m)   Wt 135 lb (61.2 kg)   LMP  (LMP Unknown)   SpO2 99%   BMI 24.69 kg/m    Subjective:    Patient ID: Sherry Floyd, female    DOB: 2000-11-23, 24 y.o.   MRN: [id number]  HPI: Sherry Floyd is a 24 y.o. female  Chief Complaint  Patient presents with   Chest Pain    Pt c/o left side rib pain x8 months. Denies injury, sob, and irregular bowel movements. States she stopped using depo in November. Has not had menstrual since 2023.     Discussed the use of AI scribe software for clinical note transcription with the patient, who gave verbal consent to proceed.  History of Present Illness Sherry Floyd is a 24 year old female who presents with left-sided chest pain.  Left-sided chest pain - Intermittent pain since June 2025 - Pain localized behind the chest wall, particularly at the costosternal junction - Pain sometimes worsens with movement - Pain can be severe enough to wake her from sleep, as occurred last night at 3 AM - Sitting up provides instant relief from pain - No associated fever, shortness of breath, or recent injury - Ibuprofen  has not provided relief - Previous evaluations suggested trapped gas or a pulled muscle  Amenorrhea and contraceptive history - Last Depo-Provera  injection in November 2023 - No menstrual period since stopping Depo-Provera  - Was due for next birth control dose around December 30th-31st, 2025, but chose to discontinue due to fatigue  Urinary symptoms - Onset of symptoms two to three days ago - Sensation of pelvic pressure described as 'like having to push a baby out' - No fever or increased urinary frequency - History of solitary kidney, leading to increased caution regarding urinary tract infections  Sexual activity - Not currently sexually active - Only one episode of intercourse since stopping birth control - Attempting to be cautious with sexual  activity     EKG: normal EKG, normal sinus rhythm    05/15/2024    9:48 AM 01/23/2024    1:36 PM 01/23/2024    1:33 PM  Depression screen PHQ 2/9  Decreased Interest 0 0 0  Down, Depressed, Hopeless 0 0 0  PHQ - 2 Score 0 0 0  Altered sleeping 0 0 0  Tired, decreased energy 0 1 1  Change in appetite 0 0 1  Feeling bad or failure about yourself  0 0 0  Trouble concentrating 0 0 0  Moving slowly or fidgety/restless 0 0 0  Suicidal thoughts 0 0 0  PHQ-9 Score 0  1  2   Difficult doing work/chores Not difficult at all Not difficult at all Not difficult at all     Data saved with a previous flowsheet row definition    Relevant past medical, surgical, family and social history reviewed and updated as indicated. Interim medical history since our last visit reviewed. Allergies and medications reviewed and updated.  Review of Systems  Ten systems reviewed and is negative except as mentioned in HPI      Objective:      BP 102/70   Pulse 97   Temp 98 F (36.7 C)   Ht 5' 2 (1.575 m)   Wt 135 lb (61.2 kg)   LMP  (LMP Unknown)   SpO2 99%   BMI 24.69 kg/m  Wt Readings from Last 3 Encounters:  12/13/24 135 lb (61.2 kg)  06/11/24 127 lb (57.6 kg)  05/15/24 130 lb 1.6 oz (59 kg)    Physical Exam GENERAL: Alert, cooperative, well developed, no acute distress HEENT: Normocephalic, normal oropharynx, moist mucous membranes CHEST: Clear to auscultation bilaterally, no wheezes, rhonchi, or crackles CARDIOVASCULAR: Normal heart rate and rhythm, S1 and S2 normal without murmurs ABDOMEN: Soft, non-tender, non-distended, without organomegaly, normal bowel sounds, no costovertebral angle tenderness EXTREMITIES: No cyanosis or edema NEUROLOGICAL: Cranial nerves grossly intact, moves all extremities without gross motor or sensory deficit  Results for orders placed or performed in visit on 12/13/24  POCT urine pregnancy   Collection Time: 12/13/24 10:48 AM  Result Value Ref Range    Preg Test, Ur Negative Negative  POCT urinalysis dipstick   Collection Time: 12/13/24 10:49 AM  Result Value Ref Range   Color, UA yellow    Clarity, UA clear    Glucose, UA Negative Negative   Bilirubin, UA negative    Ketones, UA small    Spec Grav, UA 1.015 1.010 - 1.025   Blood, UA negative    pH, UA 5.0 5.0 - 8.0   Protein, UA Negative Negative   Urobilinogen, UA 0.2 0.2 or 1.0 E.U./dL   Nitrite, UA negative    Leukocytes, UA Negative Negative   Appearance     Odor            Assessment & Plan:   Problem List Items Addressed This Visit   None Visit Diagnoses       Costochondritis    -  Primary   Relevant Medications   predniSONE  (STERAPRED UNI-PAK 21 TAB) 10 MG (21) TBPK tablet   Other Relevant Orders   EKG 12-Lead   CBC with Differential/Platelet   Comprehensive metabolic panel with GFR   TSH     Immunization due         Dysuria       Relevant Orders   POCT urine pregnancy (Completed)   POCT urinalysis dipstick (Completed)   Cervicovaginal ancillary only     Screening for cholesterol level       Relevant Orders   Lipid panel     Screening for diabetes mellitus       Relevant Orders   Comprehensive metabolic panel with GFR   Hemoglobin A1c     Screening for deficiency anemia       Relevant Orders   CBC with Differential/Platelet        Assessment and Plan Assessment & Plan Costochondritis Intermittent left-sided chest pain for eight months, localized behind the chest wall and at the costosternal junction. Pain exacerbated by movement. No fever or shortness of breath. Previous EKGs were normal. Differential diagnosis includes costochondritis, which is suspected based on symptoms and location of pain. - Ordered EKG to rule out cardiac causes - Initiated prednisone  taper for inflammation - Will consider ibuprofen  if steroids are not tolerated  Dysuria Recent onset of dysuria with sensation of pressure, no fever, and no increased frequency of  urination. Urine dipstick test was negative for infection. Pregnancy test was negative. Symptoms began two days ago. - Performed urinalysis to evaluate for urinary tract infection - Conducted blood work to assess overall health status -ordered vaginal swab as well        Follow up plan: Return if symptoms worsen or fail to improve. "

## 2024-12-13 NOTE — Addendum Note (Signed)
 Addended by: Tandi Hanko F on: 12/13/2024 11:20 AM   Modules accepted: Orders

## 2024-12-14 ENCOUNTER — Ambulatory Visit: Payer: Self-pay | Admitting: Nurse Practitioner

## 2024-12-14 ENCOUNTER — Encounter: Payer: Self-pay | Admitting: Nurse Practitioner

## 2024-12-14 LAB — COMPREHENSIVE METABOLIC PANEL WITH GFR
AG Ratio: 2.8 (calc) — ABNORMAL HIGH (ref 1.0–2.5)
ALT: 15 U/L (ref 6–29)
AST: 12 U/L (ref 10–30)
Albumin: 4.8 g/dL (ref 3.6–5.1)
Alkaline phosphatase (APISO): 73 U/L (ref 31–125)
BUN: 8 mg/dL (ref 7–25)
CO2: 28 mmol/L (ref 20–32)
Calcium: 9.8 mg/dL (ref 8.6–10.2)
Chloride: 107 mmol/L (ref 98–110)
Creat: 0.73 mg/dL (ref 0.50–0.96)
Globulin: 1.7 g/dL — ABNORMAL LOW (ref 1.9–3.7)
Glucose, Bld: 91 mg/dL (ref 65–99)
Potassium: 4.7 mmol/L (ref 3.5–5.3)
Sodium: 141 mmol/L (ref 135–146)
Total Bilirubin: 0.5 mg/dL (ref 0.2–1.2)
Total Protein: 6.5 g/dL (ref 6.1–8.1)
eGFR: 118 mL/min/1.73m2

## 2024-12-14 LAB — LIPID PANEL
Cholesterol: 127 mg/dL
HDL: 70 mg/dL
LDL Cholesterol (Calc): 45 mg/dL
Non-HDL Cholesterol (Calc): 57 mg/dL
Total CHOL/HDL Ratio: 1.8 (calc)
Triglycerides: 42 mg/dL

## 2024-12-14 LAB — TSH: TSH: 1.05 m[IU]/L

## 2024-12-14 LAB — CBC WITH DIFFERENTIAL/PLATELET
Absolute Lymphocytes: 1669 {cells}/uL (ref 850–3900)
Absolute Monocytes: 389 {cells}/uL (ref 200–950)
Basophils Absolute: 22 {cells}/uL (ref 0–200)
Basophils Relative: 0.4 %
Eosinophils Absolute: 92 {cells}/uL (ref 15–500)
Eosinophils Relative: 1.7 %
HCT: 44.2 % (ref 35.9–46.0)
Hemoglobin: 14.5 g/dL (ref 11.7–15.5)
MCH: 28.9 pg (ref 27.0–33.0)
MCHC: 32.8 g/dL (ref 31.6–35.4)
MCV: 88 fL (ref 81.4–101.7)
MPV: 12.6 fL — ABNORMAL HIGH (ref 7.5–12.5)
Monocytes Relative: 7.2 %
Neutro Abs: 3229 {cells}/uL (ref 1500–7800)
Neutrophils Relative %: 59.8 %
Platelets: 213 Thousand/uL (ref 140–400)
RBC: 5.02 Million/uL (ref 3.80–5.10)
RDW: 12.5 % (ref 11.0–15.0)
Total Lymphocyte: 30.9 %
WBC: 5.4 Thousand/uL (ref 3.8–10.8)

## 2024-12-14 LAB — HEMOGLOBIN A1C
Hgb A1c MFr Bld: 5 %
Mean Plasma Glucose: 97 mg/dL
eAG (mmol/L): 5.4 mmol/L

## 2024-12-19 ENCOUNTER — Other Ambulatory Visit: Payer: Self-pay | Admitting: Nurse Practitioner

## 2024-12-19 ENCOUNTER — Encounter: Payer: Self-pay | Admitting: Nurse Practitioner
